# Patient Record
Sex: Male | Born: 1950 | Race: Black or African American | Hispanic: No | Marital: Married | Smoking: Former smoker
Health system: Southern US, Community
[De-identification: ages and names within clinical notes are randomized; demographics above are authoritative.]

## PROBLEM LIST (undated history)

## (undated) DIAGNOSIS — N189 Chronic kidney disease, unspecified: Secondary | ICD-10-CM

## (undated) DIAGNOSIS — T7840XA Allergy, unspecified, initial encounter: Secondary | ICD-10-CM

## (undated) DIAGNOSIS — E785 Hyperlipidemia, unspecified: Secondary | ICD-10-CM

## (undated) DIAGNOSIS — I10 Essential (primary) hypertension: Secondary | ICD-10-CM

## (undated) DIAGNOSIS — M1990 Unspecified osteoarthritis, unspecified site: Secondary | ICD-10-CM

## (undated) DIAGNOSIS — I639 Cerebral infarction, unspecified: Secondary | ICD-10-CM

## (undated) HISTORY — DX: Chronic kidney disease, unspecified: N18.9

## (undated) HISTORY — DX: Cerebral infarction, unspecified: I63.9

## (undated) HISTORY — DX: Hyperlipidemia, unspecified: E78.5

## (undated) HISTORY — DX: Unspecified osteoarthritis, unspecified site: M19.90

## (undated) HISTORY — DX: Essential (primary) hypertension: I10

## (undated) HISTORY — PX: OTHER SURGICAL HISTORY: SHX169

## (undated) HISTORY — DX: Allergy, unspecified, initial encounter: T78.40XA

---

## 2017-05-20 DIAGNOSIS — I639 Cerebral infarction, unspecified: Secondary | ICD-10-CM

## 2017-05-20 HISTORY — DX: Cerebral infarction, unspecified: I63.9

## 2017-09-22 ENCOUNTER — Telehealth (INDEPENDENT_AMBULATORY_CARE_PROVIDER_SITE_OTHER): Payer: Self-pay | Admitting: Neurology

## 2017-09-22 ENCOUNTER — Encounter (INDEPENDENT_AMBULATORY_CARE_PROVIDER_SITE_OTHER): Payer: Self-pay | Admitting: Neurology

## 2017-09-22 LAB — POCT INR: INR: 1 (ref 0.9–1.1)

## 2017-09-22 LAB — PROTIME-INR: Protime: 10.6 (ref 10.0–13.8)

## 2017-09-22 NOTE — Progress Notes (Unsigned)
Onset about 9:30 am today, dysarthia. Seems to be better now but persists. I was called at 6:02 pm.  No weak,numbness, trouble seeing or walking.     Video shows mild dysarthria exam ok other wise.  nih 1.     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    Too late for tpa.  Suggest cta intra and extracranial circ.  Melina Modena, MD  09/22/2017, 21:13

## 2017-09-22 NOTE — Progress Notes (Unsigned)
onset

## 2017-09-26 LAB — BASIC METABOLIC PANEL
BUN: 12 (ref 4–21)
Creatinine: 1.2 (ref 0.6–1.3)
GLUCOSE: 71
Potassium: 4 (ref 3.4–5.3)
Sodium: 142 (ref 137–147)

## 2017-09-26 LAB — HEPATIC FUNCTION PANEL
ALK PHOS: 76 (ref 25–125)
ALT: 18 (ref 10–40)
AST: 25 (ref 14–40)
BILIRUBIN, TOTAL: 1.2

## 2017-09-26 LAB — CBC AND DIFFERENTIAL
HEMATOCRIT: 36 — AB (ref 41–53)
Hemoglobin: 11.8 — AB (ref 13.5–17.5)
Platelets: 201 (ref 150–399)
WBC: 5.2

## 2017-09-26 LAB — LIPID PANEL
Cholesterol: 240 — AB (ref 0–200)
HDL: 49 (ref 35–70)
LDL Cholesterol: 168
TRIGLYCERIDES: 112 (ref 40–160)

## 2017-09-26 LAB — HEMOGLOBIN A1C: Hemoglobin A1C: 5.9

## 2017-09-30 ENCOUNTER — Ambulatory Visit: Payer: Medicare PPO | Attending: Family Medicine | Admitting: Speech Pathology

## 2017-09-30 ENCOUNTER — Other Ambulatory Visit: Payer: Self-pay

## 2017-09-30 ENCOUNTER — Encounter: Payer: Self-pay | Admitting: Speech Pathology

## 2017-09-30 DIAGNOSIS — I69391 Dysphagia following cerebral infarction: Secondary | ICD-10-CM | POA: Insufficient documentation

## 2017-09-30 DIAGNOSIS — R471 Dysarthria and anarthria: Secondary | ICD-10-CM

## 2017-09-30 DIAGNOSIS — I69322 Dysarthria following cerebral infarction: Secondary | ICD-10-CM | POA: Insufficient documentation

## 2017-09-30 DIAGNOSIS — R1312 Dysphagia, oropharyngeal phase: Secondary | ICD-10-CM | POA: Diagnosis not present

## 2017-09-30 DIAGNOSIS — I6990 Unspecified sequelae of unspecified cerebrovascular disease: Secondary | ICD-10-CM | POA: Diagnosis present

## 2017-09-30 NOTE — Patient Instructions (Signed)
   SLOW LOUD OVER-ENNUNCIATE PAUSE  PA TA KA  PATA TAKA KAPA PATAKA  BUTTERCUP  CATERPILLAR  BASEBALLL PLAYER  [place]  [place]  SLOW AND BIG - EXAGGERATE YOUR MOUTH, MAKE EACH CONSONANT    Oral exercises/Swallow exercises  Use the mirror - Do 15x each twice a day  Say "ooo" then "eee"  BIG!  Stick out your tongue and curl tongue up to your nose  Scrape the roof of your mouth from your teeth to your throat   Move the tongue around your top and bottom teeth in a circle, clockwise, then counter clockwise  Click your tongue on the roof of your mouth, like a horse sound  Push out each cheek with your tongue, alternate side to side - push in with your finger to add resistance   Eat high protein, higher fat foods - protein shakes, add protein powder or peanut butter to smoothies  Whole fat Greek yogurt, whole fat coconut milk

## 2017-10-02 ENCOUNTER — Ambulatory Visit: Payer: Medicare PPO | Admitting: Speech Pathology

## 2017-10-02 DIAGNOSIS — R1312 Dysphagia, oropharyngeal phase: Secondary | ICD-10-CM

## 2017-10-02 DIAGNOSIS — I69391 Dysphagia following cerebral infarction: Secondary | ICD-10-CM | POA: Diagnosis not present

## 2017-10-02 NOTE — Therapy (Signed)
[facility] [address] [place], [place], [address] Phone: [phone]   Fax:  [phone]  Speech Language Pathology Evaluation  Patient Details  Name: Randall James MRN: [id number] Date of Birth: 1950/09/21 Referring Provider: Newman Nip [facility]   Encounter Date: 09/30/2017  End of Session - 10/02/17 0834    SLP Start Time  0932    SLP Stop Time   1016    SLP Time Calculation (min)  44 min    Activity Tolerance  Patient tolerated treatment well       History reviewed. No pertinent past medical history.  History reviewed. No pertinent surgical history.  There were no vitals filed for this visit.      SLP Evaluation [facility] - 10/02/17 0830      SLP Visit Information   SLP Received On  09/30/17    Referring Provider  Newman Nip [facility]    Onset Date  09/22/17    Medical Diagnosis  left basal ganglia CVA      Subjective   Patient/Family Stated Goal  To speak better and chew my food      Pain Assessment   Currently in Pain?  No/denies      General Information   HPI  Pt was admitted to [facility] in [place] 09/22/17 to 09/25/17 due to left basal ganglia infarct on MRI. CT revealed old anterior horn right internal capsule infarct, otherwise negative. MBSS 09/25/17 revealed delayed initiation of swallow at the pyriforms, reduced tongue base retraction, partial epiglottic inversation, partial anterior hyoid excursion and reduced laryngeal elevation. Penetration to the vocal folds with thin liquids with spontaneous cough to clear. Chin tuck ineffective, diffuse residue on tongue base, pharyngeal wall, valleculae and aryepiglottic folds. Oral dysphagia with residue throughout mouth, disorganized chewing, sluggish tongue movement and labibal leakage. Mechanical soft diet with nectar thick liquids was recommended with small bites/sips, multiple dry swallows, alternate solids/liquids, check for pocketing.     Mobility Status  walks independently      Balance Screen   Has the patient fallen in the past 6 months  No    Has the patient had a decrease in activity level because of a fear of falling?   No    Is the patient reluctant to leave their home because of a fear of falling?   No      Prior Functional Status   Cognitive/Linguistic Baseline  Within functional limits    Type of Home  House     Lives With  Spouse;Family    Available Support  Family    Vocation  Retired      Land Function   Overall Oral Motor/Sensory Function  Impaired    Labial ROM  Reduced right    Labial Symmetry  Abnormal symmetry right    Labial Strength  Reduced Right    Labial Sensation  Within Functional Limits    Labial Coordination  Reduced    Lingual ROM  Reduced right;Reduced left    Lingual Symmetry  Abnormal symmetry right    Lingual Strength  Reduced Right    Lingual Sensation  Within Functional Limits    Lingual Coordination  Reduced    Facial ROM  Within Functional Limits    Velum  Impaired right      Motor Speech   Overall Motor Speech  Impaired    Respiration  Within functional limits    Phonation  Hoarse    Resonance  Hypernasality  Articulation  Impaired    Level of Impairment  Word    Intelligibility  Intelligibility reduced    Word  75-100% accurate    Phrase  75-100% accurate    Sentence  75-100% accurate    Conversation  50-74% accurate    Motor Planning  Witnin functional limits    Motor Speech Errors  Aware    Effective Techniques  Slow rate;Increased vocal intensity;Over-articulate;Pause                      SLP Education - 10/02/17 [phone]    Education provided  Yes    Education Details  HEP for dysphagia and dysarthria; diet modifications and swallow precautions    Person(s) Educated  Patient;Spouse;Child(ren)    Methods  Explanation;Demonstration;Verbal cues;Handout    Comprehension  Verbalized understanding;Returned demonstration;Verbal cues  required;Need further instruction       SLP Short Term Goals - 10/02/17 0835      SLP SHORT TERM GOAL #1   Title  Pt will follow swallow precautions with mod I over 2 sessions and per pt/family report    Time  6    Period  Weeks    Status  New      SLP SHORT TERM GOAL #2   Title  Pt will perform HEP for dysphagia and dysarthria with rare min A over 2 sessions    Time  6    Period  Weeks    Status  New      SLP SHORT TERM GOAL #3   Title  Pt will utilize compensations for dysarthria with rare min A in simple conversation for 12 minutes over 2 sessions    Time  6    Period  Weeks    Status  New      SLP SHORT TERM GOAL #4   Title  Pt/family will verbalize s/s of aspiration pna with rare min A    Time  6    Period  Weeks    Status  New      SLP SHORT TERM GOAL #5   Title  Pt will complete repeat objective swallow study    Time  6    Period  Weeks    Status  New       SLP Long Term Goals - 10/02/17 9604      SLP LONG TERM GOAL #1   Title  Pt will follow swallow precautions independently over 2 sessions    Time  12    Period  Weeks    Status  New      SLP LONG TERM GOAL #2   Title  Pt will tolerate mechanical soft solids (Dysphagia 3) and thin liquids with independent use of compensations    Baseline  pending repeat MBSS results    Time  12    Period  Weeks    Status  New      SLP LONG TERM GOAL #3   Title  Pt will utilize compensations for dysarthria to be 95% intellgilble in noisy environment over 15 minute conversation with rare min A    Time  12    Period  Weeks    Status  New       Plan - 10/02/17 5409    Clinical Impression Statement  [name] is referred for outpt speech therapy 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 MBSS 09/25/17 revealed moderate oropharyngeal dysphagia. See results in HPI and recommended  mechanical soft solids and nectar thick liquids, Upon clinical swallow eval today, pt was not able to manipulate or  masticate a cereal bar. The small bite resulted in minimal bolus manipulation, with bolus remaining anterior tongue. Attempts to masticate resulting in pieces of bar being scattered through his mouth, and eventually he expactorated bolus. At this time, Mr. Creps is only taking Ensure and jello. Improvement in timing of swallow initiation with thin liquid sips today. Family reported improvement of thin liquid swallows, as pt previously coughed significantly immediately. Pt with moderate dysarthria. He recalled compensations for dysathria and utilized them with success in a quiet environment. I had to ask for repetition 3x during the evaluation. Intelligibility is judged to be 80% at simple, short conversation. I recommend skilled ST to maximize intellgilbity and for safe diet advancement to improve nutrition/hydration and QOL.  Repeat swallow study will be warranted prior to diet upgrade.   Speech Therapy Frequency  2x / week    Treatment/Interventions  Aspiration precaution training;Environmental controls;Cueing hierarchy;Oral motor exercises;SLP instruction and feedback;Compensatory strategies;Functional tasks;Diet toleration management by SLP;Trials of upgraded texture/liquids;Internal/external aids;Patient/family education    Potential to Achieve Goals  Good    Potential Considerations  Severity of impairments    Consulted and Agree with Plan of Care  Patient;Family member/caregiver    Family Member Consulted  wife, Nelva [facility]; daughter, April       Patient will benefit from skilled therapeutic intervention in order to improve the following deficits and impairments:   Dysarthria and anarthria  Dysphagia, oropharyngeal phase    Problem List There are no active problems to display for this patient.   Neyla Gauntt, Radene Journey MS, CCC-SLP 10/02/2017, 8:41 AM  [facility] [address] [place], [place], [address] Phone: [phone]   Fax:   [phone]  Name: Randall James MRN: [id number] Date of Birth: [birth year]

## 2017-10-02 NOTE — Patient Instructions (Addendum)
HOME SWALLOWING THERAPY PROGRAM These exercises will help to build strength and maintain the range of motion in the structures that contribute to swallowing.   For all exercises, complete routine 2-3 times a day.  ?     Tongue Press   Press your entire tongue to roof of mouth and hold for 3 seconds. x10  ?     Head Lift Exercise (Shaker)   Lie down.  Lift head enough to see your toes and hold for 45-60 seconds. Be sure not to hold your breath.  Relax for 60 seconds.  Then repeat 2 more times.  Next, raise and lower your head up and down for 30 lifts/pulses.    ?     Tongue pull-back    Hold your tongue with a piece of gauze and resist as you pull the base of your tongue towards the back of your mouth. Hold for 3-5 seconds.                                                                                                                              ?     Effortful/Hard Swallow  . Gather some saliva/ or you can take a small sip of nectar thick liquid.  Swallow as hard as you can, squeezing all the muscles in your throat. . Aim for 25 swallows  ?     Google  . Try to do a long, slow swallow.  At the height of your swallow (when you gulp), hold your throat in the upright position for 3-5 seconds, then relax. . At first, see if you can do 5-10 swallows. . Work up to 3-4 sets of 10 swallows, with a 5 minute rest in between.

## 2017-10-02 NOTE — Therapy (Signed)
[facility] [address] [place], [place], [address] Phone: [phone]   Fax:  [phone]  Speech Language Pathology Treatment  Patient Details  Name: Randall James MRN: [id number] Date of Birth: 05-19-1951 Referring Provider: Newman Nip [facility]   Encounter Date: 10/02/2017  End of Session - 10/02/17 1751    Visit Number  2    Number of Visits  25    Date for SLP Re-Evaluation  12/23/17    SLP Start Time  1447    SLP Stop Time   1530    SLP Time Calculation (min)  43 min    Activity Tolerance  Patient tolerated treatment well       No past medical history on file.  No past surgical history on file.  There were no vitals filed for this visit.  Subjective Assessment - 10/02/17 1452    Subjective  "I branched out" (pt reports eating mashed potatoes, soft shredded meat)    Patient is accompained by:  Family member Randall James    Currently in Pain?  No/denies        SLP Evaluation [facility] - 10/02/17 0830      SLP Visit Information   SLP Received On  09/30/17    Referring Provider  Cariss Wardell Heath [facility]    Onset Date  09/22/17    Medical Diagnosis  left basal ganglia CVA      Subjective   Patient/Family Stated Goal  To speak better and chew my food      Pain Assessment   Currently in Pain?  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 CT revealed old anterior horn right internal capsule infarct, otherwise negative. MBSS 09/25/17 revealed delayed initiation of swallow at the pyriforms, reduced tongue base retraction, partial epiglottic inversation, partial anterior hyoid excursion and reduced laryngeal elevation. Penetration to the vocal folds with thin liquids with spontaneous cough to clear. Chin tuck ineffective, diffuse residue on tongue base, pharyngeal wall, valleculae and aryepiglottic folds. Oral  dysphagia with residue throughout mouth, disorganized chewing, sluggish tongue movement and labibal leakage. Mechanical soft diet with nectar thick liquids was recommended with small bites/sips, multiple dry swallows, alternate solids/liquids, check for pocketing.     Mobility Status  walks independently      Balance Screen   Has the patient fallen in the past 6 months  No    Has the patient had a decrease in activity level because of a fear of falling?   No    Is the patient reluctant to leave their home because of a fear of falling?   No      Prior Functional Status   Cognitive/Linguistic Baseline  Within functional limits    Type of Home  House     Lives With  Spouse;Family    Available Support  Family    Vocation  Retired      Oral Motor/Sensory Function   Overall Oral Motor/Sensory Function  Impaired    Labial ROM  Reduced right    Labial Symmetry  Abnormal symmetry right    Labial Strength  Reduced Right    Labial Sensation  Within Functional Limits    Labial Coordination  Reduced    Lingual ROM  Reduced right;Reduced left    Lingual Symmetry  Abnormal symmetry right    Lingual Strength  Reduced  Right    Lingual Sensation  Within Functional Limits    Lingual Coordination  Reduced    Facial ROM  Within Functional Limits    Velum  Impaired right      Motor Speech   Overall Motor Speech  Impaired    Respiration  Within functional limits    Phonation  Hoarse    Resonance  Hypernasality    Articulation  Impaired    Level of Impairment  Word    Intelligibility  Intelligibility reduced    Word  75-100% accurate    Phrase  75-100% accurate    Sentence  75-100% accurate    Conversation  50-74% accurate    Motor Planning  Witnin functional limits    Motor Speech Errors  Aware    Effective Techniques  Slow rate;Increased vocal intensity;Over-articulate;Pause         ADULT SLP TREATMENT - 10/02/17 1448      General Information   Behavior/Cognition   Alert;Cooperative;Pleasant mood      Treatment Provided   Treatment provided  Dysphagia      Dysphagia Treatment   Temperature Spikes Noted  No    Respiratory Status  Room air    Patient observed directly with PO's  Yes    Type of PO's observed  Nectar-thick liquids    Feeding  Able to feed self    Liquids provided via  Cup    Type of cueing  Verbal;Tactile;Visual    Amount of cueing  Moderate    Other treatment/comments  SLP provided additional exercises to target base of tongue retraction and hyolarygneal excursion. Trained pt in tongue press (10 reps) effortful swallow (reps x10, pt requires extended time), Mendelsohn manuever (4 accurate repetitions), Shaker (3x 60 second holds and 30 1 second reps), tongue pull-back (10 reps). Occasional min-mod A for technique. Provided pt with written instructions; pt to complete reps as tolerated 2-3 times per day.       Pain Assessment   Pain Assessment  No/denies pain      Assessment / Recommendations / Plan   Plan  Continue with current plan of care      Dysphagia Recommendations   Diet recommendations  Dysphagia 1 (puree);Nectar-thick liquid    Liquids provided via  Cup    Compensations  Check for pocketing;Check for anterior loss;Small sips/bites      Progression Toward Goals   Progression toward goals  Progressing toward goals       SLP Education - 10/02/17 1751    Education provided  Yes    Education Details  Pharyngeal exercises added to HEP    Person(s) Educated  Spouse;Child(ren)    Methods  Explanation;Demonstration;Tactile cues;Verbal cues;Handout    Comprehension  Verbalized understanding;Returned demonstration;Need further instruction;Verbal cues required       SLP Short Term Goals - 10/02/17 1805      SLP SHORT TERM GOAL #1   Title  Pt will follow swallow precautions with mod I over 2 sessions and per pt/family report    Time  6    Period  Weeks    Status  On-going      SLP SHORT TERM GOAL #2   Title  Pt will  perform HEP for dysphagia and dysarthria with rare min A over 2 sessions    Time  6    Period  Weeks    Status  On-going      SLP SHORT TERM GOAL #3   Title  Pt will  utilize compensations for dysarthria with rare min A in simple conversation for 12 minutes over 2 sessions    Time  6    Period  Weeks    Status  On-going      SLP SHORT TERM GOAL #4   Title  Pt/family will verbalize s/s of aspiration pna with rare min A    Time  6    Period  Weeks    Status  On-going      SLP SHORT TERM GOAL #5   Title  Pt will complete repeat objective swallow study    Time  6    Period  Weeks    Status  On-going       SLP Long Term Goals - 10/02/17 1805      SLP LONG TERM GOAL #1   Title  Pt will follow swallow precautions independently over 2 sessions    Time  12    Period  Weeks    Status  On-going      SLP LONG TERM GOAL #2   Title  Pt will tolerate mechanical soft solids (Dysphagia 3) and thin liquids with independent use of compensations    Time  12    Period  Weeks    Status  On-going      SLP LONG TERM GOAL #3   Title  Pt will utilize compensations for dysarthria to be 95% intellgilble in noisy environment over 15 minute conversation with rare min A    Time  12    Period  Weeks    Status  On-going       Plan - 10/02/17 1752    Clinical Impression Statement  Randall James is referred for outpt speech therapy 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 MBSS 09/25/17 revealed moderate oropharyngeal dysphagia. See results in HPI and recommended mechanical soft solids and nectar thick liquids, Upon clinical swallow eval, pt was not able to manipulate or masticate a cereal bar. Most revealed reduce base of tongue retraction, reduced hyolaryngeal elevation/excursion, therefore pharyngeal exercises added to HEP today. I recommend skilled ST to maximize intelligibility and for safe diet advancement to improve nutrition/hydration and QOL.  Repeat swallow study will be  warranted prior to diet upgrade.     Speech Therapy Frequency  2x / week    Treatment/Interventions  Aspiration precaution training;Environmental controls;Cueing hierarchy;Oral motor exercises;SLP instruction and feedback;Compensatory strategies;Functional tasks;Diet toleration management by SLP;Trials of upgraded texture/liquids;Internal/external aids;Patient/family education    Potential to Achieve Goals  Good    Potential Considerations  Severity of impairments    Consulted and Agree with Plan of Care  Patient;Family member/caregiver    Family Member Consulted  wife, Randall James; daughter, Randall James       Patient will benefit from skilled therapeutic intervention in order to improve the following deficits and impairments:   Dysphagia, oropharyngeal phase    Problem List There are no active problems to display for this patient.  Rondel Baton, MS, CCC-SLP Speech-Language Pathologist   Arlana Lindau 10/02/2017, 6:06 PM  [facility] [address] [place], [place], [address] Phone: [phone]   Fax:  [phone]   Name: Randall James MRN: [id number] Date of Birth: 05-29-1950

## 2017-10-10 ENCOUNTER — Other Ambulatory Visit (HOSPITAL_COMMUNITY): Payer: Self-pay | Admitting: Family

## 2017-10-10 DIAGNOSIS — R1310 Dysphagia, unspecified: Secondary | ICD-10-CM

## 2017-10-16 ENCOUNTER — Ambulatory Visit (HOSPITAL_COMMUNITY)
Admission: RE | Admit: 2017-10-16 | Discharge: 2017-10-16 | Disposition: A | Discharge status: Home / Self Care | Payer: Medicare PPO | Source: Ambulatory Visit | Attending: Family | Admitting: Family

## 2017-10-16 ENCOUNTER — Ambulatory Visit: Payer: Medicare PPO | Admitting: Speech Pathology

## 2017-10-16 DIAGNOSIS — R1310 Dysphagia, unspecified: Secondary | ICD-10-CM | POA: Diagnosis present

## 2017-10-16 DIAGNOSIS — R471 Dysarthria and anarthria: Secondary | ICD-10-CM

## 2017-10-16 DIAGNOSIS — R1312 Dysphagia, oropharyngeal phase: Secondary | ICD-10-CM | POA: Diagnosis not present

## 2017-10-16 DIAGNOSIS — I69391 Dysphagia following cerebral infarction: Secondary | ICD-10-CM | POA: Diagnosis not present

## 2017-10-16 NOTE — Therapy (Signed)
Modified Barium Swallow Progress Note  Patient Details  Name: Randall James MRN: [id number] Date of Birth: 24-Apr-1951  Today's Date: 10/16/2017  Modified Barium Swallow completed.  Full report located under Chart Review in the Imaging Section.  Brief recommendations include the following:  Clinical Impression Pt presents with mild oropharyngeal dysphagia, characterized as follows:   ORALLY, pt exhibits adequate bolus formation and posterior propulsion of liquids and puree. Oral prep of solids was extended, with piecemeal swallow and slight oral residue. Dry swallow effectively cleared oral residue. Premature spillage over the tongue base was noted across consistencies.  PHARYNGEALLY, pt exhibits delayed swallow reflex across consistencies, with trigger at the pyriform sinus on thin and nectar thick liquids, and at the vallecular sinus on puree and solid consistencies. A delay of this significance increases risk for aspiration before the swallow on liquids. Intermittent (2 episodes), trace, flash penetration was seen on thin liquid via cup and straw (seen only once on each). Penetrate cleared completely and spontaneously, and was not aspirated. Multiple presentations were given of both cup and straw drinking. There was no post-swallow residue on any consistency.  At this time, recommend continuing with soft moistened solids with thin liquids via cup or straw, Adherence to safe swallow precautions is critical. These were reviewed with pt and daughter, and provided to them in written form. Pt's daughter indicated an appointment with OP Speech Therapy this afternoon. Results of this assessment were relayed to primary SLP. Will defer ongoing recommendations for treatment to outpatient therapist.     Swallow Evaluation Recommendations  SLP Diet Recommendations: Dysphagia 3 (Mech soft) solids;Thin liquid   Liquid Administration via: Cup;Straw   Medication Administration: Other (Comment)(1  at a time)   Supervision: Patient able to self feed;Full supervision/cueing for compensatory strategies   Compensations: Minimize environmental distractions;Slow rate;Small sips/bites   Postural Changes: Remain semi-upright after after feeds/meals (Comment);Seated upright at 90 degrees   Oral Care Recommendations: Oral care QID     Rula Keniston B. Murvin Natal, [facility], CCC-SLP Speech Language Pathologist [phone]   Leigh Aurora 10/16/2017,2:16 PM

## 2017-10-16 NOTE — Therapy (Signed)
[facility] [address] [place], [place], [address] Phone: [phone]   Fax:  [phone]  Speech Language Pathology Treatment  Patient Details  Name: Randall James MRN: [id number] Date of Birth: 08/02/1950 Referring Provider: Newman Nip [facility]   Encounter Date: 10/16/2017  End of Session - 10/16/17 1847    Visit Number  3    Number of Visits  25    Date for SLP Re-Evaluation  12/23/17    SLP Start Time  1533    SLP Stop Time   1617    SLP Time Calculation (min)  44 min    Activity Tolerance  Patient tolerated treatment well       No past medical history on file.  No past surgical history on file.  There were no vitals filed for this visit.  Subjective Assessment - 10/16/17 1541    Subjective  "I don't have to use thickener that much."    Patient is accompained by:  Family member Nelva Bush, and daughter April    Currently in Pain?  No/denies            ADULT SLP TREATMENT - 10/16/17 1533      General Information   Behavior/Cognition  Alert;Cooperative;Pleasant mood    Patient Positioning  Upright in chair    Oral care provided  N/A      Treatment Provided   Treatment provided  Dysphagia;Cognitive-Linquistic      Dysphagia Treatment   Temperature Spikes Noted  No    Respiratory Status  Room air    Oral Cavity - Dentition  Adequate natural dentition    Treatment Methods  Skilled observation;Upgraded PO texture trial;Compensation strategy training;Patient/caregiver education    Patient observed directly with PO's  Yes    Type of PO's observed  Thin liquids;Dysphagia 3 (soft)    Feeding  Able to feed self    Liquids provided via  Straw    Oral Phase Signs & Symptoms  Prolonged mastication;Prolonged bolus formation;Other (comment) mild residue    Type of cueing  Verbal    Amount of cueing  Moderate    Other treatment/comments  Pt had MBS this morning showing adequate pharyngeal strength,  delayed swallow initiation to pyriform sinuses/premature spillage due to oral deficits persists. SLP reviewed MBS findings, using MBS video as teachback to demonstrate necessity of following swallowing precautions (small bites and sips, slow rate, reduce distractions, moisten solids) as risk for aspiration persists. Provided written and verbal education re: swallow precautions and signs of aspiration pneumonia, and reinforced swallow precautions as pt self-fed soft solid and thin liquids. Mod cues to refrain from conversation/ ignore distractions. Revised pt's HEP to focus on oral deficits and lingual weakness, removed Shaker and Mendelsohn from pt's HEP. Pt completed 10 effortful swallows in ~70 seconds, significant improvement from previous session.       Pain Assessment   Pain Assessment  No/denies pain      Cognitive-Linquistic Treatment   Treatment focused on  Dysarthria    Skilled Treatment  Pt demo'd HEP for dysarthria with occasional min-mod A to slow rate and for overarticulation of lingual consonants. SLP provided skilled feedback during conversation for pt to use slow rate and overarticulation.       Assessment / Recommendations / Plan   Plan  Continue with current plan of care      Dysphagia Recommendations   Diet recommendations  Dysphagia 3 (mechanical soft);Thin liquid    Liquids provided via  Cup;Straw    Medication Administration  Other (Comment) one at a time, whole with liquid or puree as tolerated    Supervision  Patient able to self feed    Compensations  Slow rate;Small sips/bites;Follow solids with liquid moisten dry foods with sauce or gravy, minimize distractions      Progression Toward Goals   Progression toward goals  Progressing toward goals       SLP Education - 10/16/17 1846    Education provided  Yes    Education Details  signs of aspiration PNA, revised HEP, swallow precautions    Person(s) Educated  Patient    Methods  Explanation;Demonstration;Verbal  cues;Handout    Comprehension  Need further instruction       SLP Short Term Goals - 10/16/17 1850      SLP SHORT TERM GOAL #1   Title  Pt will follow swallow precautions with mod I over 2 sessions and per pt/family report    Time  5    Period  Weeks    Status  On-going      SLP SHORT TERM GOAL #2   Title  Pt will perform HEP for dysphagia and dysarthria with rare min A over 2 sessions    Time  5    Period  Weeks    Status  On-going      SLP SHORT TERM GOAL #3   Title  Pt will utilize compensations for dysarthria with rare min A in simple conversation for 12 minutes over 2 sessions    Time  5    Period  Weeks    Status  On-going      SLP SHORT TERM GOAL #4   Title  Pt/family will verbalize s/s of aspiration pna with rare min A    Time  5    Period  Weeks    Status  On-going      SLP SHORT TERM GOAL #5   Title  Pt will complete repeat objective swallow study    Time  5    Period  Weeks    Status  Achieved       SLP Long Term Goals - 10/16/17 1851      SLP LONG TERM GOAL #1   Title  Pt will follow swallow precautions independently over 2 sessions    Time  11    Period  Weeks    Status  On-going      SLP LONG TERM GOAL #2   Title  Pt will tolerate mechanical soft solids (Dysphagia 3) and thin liquids with independent use of compensations    Time  11    Period  Weeks    Status  On-going      SLP LONG TERM GOAL #3   Title  Pt will utilize compensations for dysarthria to be 95% intellgilble in noisy environment over 15 minute conversation with rare min A    Time  11    Period  Weeks    Status  On-going       Plan - 10/16/17 1848    Clinical Impression Statement  Randall James is seen for speech therapy 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 Repeat MBSS 10/16/17 revealed mild oropharyngeal dysphagia. See results in HPI and recommended mechanical soft solids and thin liquids. Pt requiring min A in HEP for dysphagia and dysarthria,  demonstrating improvements in oral manipulation and mastication. I recommend skilled ST to maximize intelligibility and for training in swallow  precautions to ensure safe diet advancement to improve nutrition/hydration and QOL.    Speech Therapy Frequency  2x / week    Treatment/Interventions  Aspiration precaution training;Environmental controls;Cueing hierarchy;Oral motor exercises;SLP instruction and feedback;Compensatory strategies;Functional tasks;Diet toleration management by SLP;Trials of upgraded texture/liquids;Internal/external aids;Patient/family education    Potential to Achieve Goals  Good    Potential Considerations  Severity of impairments    Consulted and Agree with Plan of Care  Patient;Family member/caregiver    Family Member Consulted  wife, Nelva Bush; daughter, April       Patient will benefit from skilled therapeutic intervention in order to improve the following deficits and impairments:   Dysphagia, oropharyngeal phase  Dysarthria and anarthria    Problem List There are no active problems to display for this patient.  Rondel Baton, MS, CCC-SLP Speech-Language Pathologist  Arlana Lindau 10/16/2017, 6:54 PM  [facility] [address] [place], [place], [address] Phone: [phone]   Fax:  [phone]   Name: Ekin Pilar MRN: [id number] Date of Birth: June 21, 1950

## 2017-10-16 NOTE — Patient Instructions (Addendum)
Swallow Precautions (do this every time you eat or drink)  - Eat soft foods with extra sauce or gravy to moisten - You can drink regular, thin liquids - Small bites and sips - Drink only one sip at time  - Straws are OK (stick to one sip though)    Signs of Aspiration Pneumonia   . Chest pain/tightness . Fever (can be low grade) . Cough  o With foul-smelling phlegm (sputum) o With sputum containing pus or blood o With greenish sputum . Fatigue  . Shortness of breath  . Wheezing   **IF YOU HAVE THESE SIGNS, CONTACT YOUR DOCTOR OR GO TO THE EMERGENCY DEPARTMENT OR URGENT CARE AS SOON AS POSSIBLE**     SLOW LOUD OVER-ENNUNCIATE PAUSE  PA TA KA  PATA TAKA KAPA PATAKA  BUTTERCUP  CATERPILLAR  BASEBALLL PLAYER  [place]  [place]  SLOW AND BIG - EXAGGERATE YOUR MOUTH, MAKE EACH CONSONANT     HOME SWALLOWING THERAPY PROGRAM These exercises will help to build strength and maintain the range of motion in the structures that contribute to swallowing.   For all exercises, complete routine 2-3 times a day.  ?     Tongue Press   Press your entire tongue to roof of mouth and hold for 3 seconds. x10   ?     Tongue pull-back               Hold your tongue with a piece of gauze and resist as you pull the base of your tongue towards the back of your mouth. Hold for 3-5 seconds. x10                                                                                                                              ?     Effortful/Hard Swallow   Gather some saliva/ or you can take a small sip of nectar thick liquid.  Swallow as hard as you can, squeezing all the muscles in your throat.  Aim for 25 swallows   Oral exercises/Swallow exercises  Use the mirror - Do 15x each twice a day  Say "ooo" then "eee"  BIG!  Stick out your tongue and curl tongue up to your nose  Scrape the roof of your mouth from your teeth to your throat    Move the tongue around your top and bottom teeth in a circle, clockwise, then counter clockwise  Click your tongue on the roof of your mouth, like a horse sound  Push out each cheek with your tongue, alternate side to side - push in with your finger to add resistance

## 2017-10-20 ENCOUNTER — Ambulatory Visit: Payer: Medicare PPO | Attending: Family Medicine | Admitting: Speech Pathology

## 2017-10-20 DIAGNOSIS — R1312 Dysphagia, oropharyngeal phase: Secondary | ICD-10-CM | POA: Insufficient documentation

## 2017-10-20 DIAGNOSIS — R471 Dysarthria and anarthria: Secondary | ICD-10-CM | POA: Insufficient documentation

## 2017-10-20 NOTE — Therapy (Signed)
[facility] [address] [place], [place], [address] Phone: [phone]   Fax:  [phone]  Speech Language Pathology Treatment  Patient Details  Name: Randall ParkerRobert Jerome James MRN: [id number] Date of Birth: [birth year] Referring Provider: [facility]   Encounter Date: 10/20/2017  End of Session - 10/20/17 1737    Visit Number  4    Number of Visits  25    Date for SLP Re-Evaluation  12/23/17    SLP Start Time  1402    SLP Stop Time   1445    SLP Time Calculation (min)  43 min    Activity Tolerance  Patient tolerated treatment well       No past medical history on file.  No past surgical history on file.  There were no vitals filed for this visit.  Subjective Assessment - 10/20/17 1405    Subjective  "I worked up to 10" (swallows)    Patient is accompained by:  Family member wife    Currently in Pain?  No/denies            ADULT SLP TREATMENT - 10/20/17 1403      General Information   Behavior/Cognition  Alert;Cooperative;Pleasant mood    Patient Positioning  Upright in chair      Treatment Provided   Treatment provided  Cognitive-Linquistic      Pain Assessment   Pain Assessment  No/denies pain      Cognitive-Linquistic Treatment   Treatment focused on  Dysarthria    Skilled Treatment  Pt/family report pt adhering to swallow precautions with occasional cues. Report he is tolerating current diet at home with no coughing/choking. Pt able to state SLOP (Slow, Loud, Overpronounce, Pause) strategies with min question cues. Pt used strategies for multisyllabic words with occasional min A, 90% intelligible for pt's wife. 2-3 sentence spontaneous responses with occasional min A for strategies, particularly pausing. Pt made several self-corrections.      Assessment / Recommendations / Plan   Plan  Continue with current plan of care      Progression Toward Goals   Progression toward goals  Progressing  toward goals       SLP Education - 10/20/17 1737    Education provided  Yes    Education Details  compensations for dysarthria    Person(s) Educated  Patient;Spouse    Methods  Explanation;Verbal cues    Comprehension  Returned demonstration;Need further instruction       SLP Short Term Goals - 10/20/17 1408      SLP SHORT TERM GOAL #1   Title  Pt will follow swallow precautions with mod I over 2 sessions and per pt/family report    Baseline  10/20/17    Time  5      SLP SHORT TERM GOAL #2   Title  Pt will perform HEP for dysphagia and dysarthria with rare min A over 2 sessions    Time  4    Period  Weeks    Status  On-going      SLP SHORT TERM GOAL #3   Title  Pt will utilize compensations for dysarthria with rare min A in simple conversation for 12 minutes over 2 sessions    Time  4    Period  Weeks    Status  On-going      SLP SHORT TERM GOAL #4   Title  Pt/family will verbalize s/s of aspiration pna with rare min A  Time  4    Period  Weeks    Status  On-going      SLP SHORT TERM GOAL #5   Title  Pt will complete repeat objective swallow study    Time  4    Period  Weeks    Status  Achieved       SLP Long Term Goals - 10/20/17 1409      SLP LONG TERM GOAL #1   Title  Pt will follow swallow precautions independently over 2 sessions    Time  10    Period  Weeks    Status  On-going      SLP LONG TERM GOAL #2   Title  Pt will tolerate mechanical soft solids (Dysphagia 3) and thin liquids with independent use of compensations    Time  10    Period  Weeks    Status  On-going      SLP LONG TERM GOAL #3   Title  Pt will utilize compensations for dysarthria to be 95% intellgilble in noisy environment over 15 minute conversation with rare min A    Time  10    Period  Weeks    Status  On-going       Plan - 10/20/17 1737    Clinical Impression Statement  Mr. 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  Repeat MBSS 10/16/17 revealed mild oropharyngeal dysphagia. See results in HPI and recommended mechanical soft solids and thin liquids. Occasional mod A for dysarthria compensations in 2-3 sentence spontaneous responses today. I recommend skilled ST to maximize intelligibility and for training in swallow precautions to ensure safe diet advancement to improve nutrition/hydration and QOL.    Speech Therapy Frequency  2x / week    Treatment/Interventions  Aspiration precaution training;Environmental controls;Cueing hierarchy;Oral motor exercises;SLP instruction and feedback;Compensatory strategies;Functional tasks;Diet toleration management by SLP;Trials of upgraded texture/liquids;Internal/external aids;Patient/family education    Potential to Achieve Goals  Good    Potential Considerations  Severity of impairments    Consulted and Agree with Plan of Care  Patient;Family member/caregiver    Family Member Consulted  wife, Nelva Bush       Patient will benefit from skilled therapeutic intervention in order to improve the following deficits and impairments:   Dysarthria and anarthria    Problem List There are no active problems to display for this patient.  Rondel Baton, [place], CCC-SLP Speech-Language Pathologist  Arlana Lindau 10/20/2017, 5:39 PM  [facility] [address] [place], [place], [address] Phone: [phone]   Fax:  [phone]   Name: Randall James MRN: [id number] Date of Birth: [birth year]

## 2017-10-22 ENCOUNTER — Ambulatory Visit: Payer: Medicare PPO

## 2017-10-22 DIAGNOSIS — R471 Dysarthria and anarthria: Secondary | ICD-10-CM | POA: Diagnosis not present

## 2017-10-22 DIAGNOSIS — R1312 Dysphagia, oropharyngeal phase: Secondary | ICD-10-CM

## 2017-10-22 NOTE — Therapy (Signed)
[facility] [address] [place], [place], [address] Phone: [phone]   Fax:  [phone]  Speech Language Pathology Treatment  Patient Details  Name: Randall James MRN: [id number] Date of Birth: Jan 25, 1951 Referring Provider: Newman Nip [facility]   Encounter Date: 10/22/2017  End of Session - 10/22/17 0959    Visit Number  5    Number of Visits  25    Date for SLP Re-Evaluation  12/23/17    SLP Start Time  0850    SLP Stop Time   0930    SLP Time Calculation (min)  40 min       History reviewed. No pertinent past medical history.  History reviewed. No pertinent surgical history.  There were no vitals filed for this visit.  Subjective Assessment - 10/22/17 0851    Subjective  "I got them." (homework)    Patient is accompained by:  Family member daughter    Currently in Pain?  No/denies            ADULT SLP TREATMENT - 10/22/17 0859      General Information   Behavior/Cognition  Alert;Cooperative;Pleasant mood      Treatment Provided   Treatment provided  Cognitive-Linquistic      Cognitive-Linquistic Treatment   Treatment focused on  Dysarthria    Skilled Treatment  Pt read phrases/sentences from his homework with 70% intelligibility with this SLP - minimal self correction unless SLP provided a nonverbal facial cue, 2nd attempt approx 50% successful at correcting articulation. Pt with most difficulty with "sh", /s/, /z/ sounds. He told SLP of four intelligibility strategies 50% (slow, overaraticulate) and got the other two with mod A.  SLP focused pt's session on /s/ production. Tactile, auditory, and verbal cues were used by SLP to improve pt's production of /s/ initial in two syllable words; success pt describes swallow precautions with occasional min A for intelligibility strategies. SLP reminded pt that in order to progress with more intelligible speech he will need to perform homework and the  HEP on a regular basis. Pt agreed.      Assessment / Recommendations / Plan   Plan  Continue with current plan of care      Progression Toward Goals   Progression toward goals  Progressing toward goals       SLP Education - 10/22/17 0959    Education provided  Yes    Education Details  practice necessary for improvement with speech    Person(s) Educated  Child(ren)    Methods  Explanation    Comprehension  Verbalized understanding       SLP Short Term Goals - 10/22/17 0918      SLP SHORT TERM GOAL #1   Title  Pt will follow swallow precautions with mod I over 2 sessions and per pt/family report    Baseline  10/20/17    Time  5      SLP SHORT TERM GOAL #2   Title  Pt will perform HEP for dysphagia and dysarthria with rare min A over 2 sessions    Time  4    Period  Weeks    Status  On-going      SLP SHORT TERM GOAL #3   Title  Pt will utilize compensations for dysarthria with rare min A in simple conversation for 12 minutes over 2 sessions    Time  4    Period  Weeks    Status  On-going  SLP SHORT TERM GOAL #4   Title  Pt/family will verbalize s/s of aspiration pna with rare min A    Time  4    Period  Weeks    Status  On-going      SLP SHORT TERM GOAL #5   Title  Pt will complete repeat objective swallow study    Time  4    Period  Weeks    Status  Achieved       SLP Long Term Goals - 10/22/17 1003      SLP LONG TERM GOAL #1   Title  Pt will follow swallow precautions independently over 2 sessions    Time  10    Period  Weeks    Status  On-going      SLP LONG TERM GOAL #2   Title  Pt will tolerate mechanical soft solids (Dysphagia 3) and thin liquids with independent use of compensations    Time  10    Period  Weeks    Status  On-going      SLP LONG TERM GOAL #3   Title  Pt will utilize compensations for dysarthria to be 95% intellgilble in noisy environment over 15 minute conversation with rare min A    Time  10    Period  Weeks    Status   On-going       Plan - 10/22/17 1000    Clinical Impression Statement  [name] presents today with 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 His self-monitoring skills for decr'd intelligibility are improving however he does not always note his errors and attempt to repeat. Assistance cotinues as necessary today for use of dysarthria compensations. I recommend cont'd skilled ST to maximize intelligibility and for training in swallow precautions to ensure safe diet advancement to improve nutrition/hydration and QOL.    Speech Therapy Frequency  2x / week    Treatment/Interventions  Aspiration precaution training;Environmental controls;Cueing hierarchy;Oral motor exercises;SLP instruction and feedback;Compensatory strategies;Functional tasks;Diet toleration management by SLP;Trials of upgraded texture/liquids;Internal/external aids;Patient/family education    Potential to Achieve Goals  Good    Potential Considerations  Severity of impairments    Consulted and Agree with Plan of Care  Patient;Family member/caregiver    Family Member Consulted  wife, Nelva Bushorma       Patient will benefit from skilled therapeutic intervention in order to improve the following deficits and impairments:   Dysarthria and anarthria  Dysphagia, oropharyngeal phase    Problem List There are no active problems to display for this patient.   [facility] Campus[facility]INKE,Anetta Olvera ,MS, CCC-SLP  10/22/2017, 10:03 AM  [facility] [address] [place], [place], [address] Phone: [phone]   Fax:  [phone]   Name: Randall James MRN: [id number] Date of Birth: Jan 31, 1951

## 2017-10-24 ENCOUNTER — Encounter: Payer: Self-pay | Admitting: Internal Medicine

## 2017-10-24 ENCOUNTER — Ambulatory Visit (INDEPENDENT_AMBULATORY_CARE_PROVIDER_SITE_OTHER): Payer: Medicare PPO | Admitting: Internal Medicine

## 2017-10-24 VITALS — BP 112/70 | HR 61 | Temp 98.0°F | Ht 67.0 in | Wt 156.0 lb

## 2017-10-24 DIAGNOSIS — I10 Essential (primary) hypertension: Secondary | ICD-10-CM | POA: Diagnosis not present

## 2017-10-24 DIAGNOSIS — I69322 Dysarthria following cerebral infarction: Secondary | ICD-10-CM | POA: Diagnosis not present

## 2017-10-24 DIAGNOSIS — I69391 Dysphagia following cerebral infarction: Secondary | ICD-10-CM | POA: Insufficient documentation

## 2017-10-24 DIAGNOSIS — Z79899 Other long term (current) drug therapy: Secondary | ICD-10-CM

## 2017-10-24 DIAGNOSIS — E782 Mixed hyperlipidemia: Secondary | ICD-10-CM

## 2017-10-24 MED ORDER — ATORVASTATIN CALCIUM 40 MG PO TABS: 40.0000 mg | ORAL_TABLET | Freq: Every day | ORAL | 0 refills | Status: DC

## 2017-10-24 MED ORDER — CLOPIDOGREL BISULFATE 75 MG PO TABS: 75.0000 mg | ORAL_TABLET | Freq: Every day | ORAL | 3 refills | Status: DC

## 2017-10-24 MED ORDER — AMLODIPINE BESYLATE 10 MG PO TABS: 10.0000 mg | ORAL_TABLET | Freq: Every day | ORAL | 0 refills | Status: DC

## 2017-10-24 NOTE — Progress Notes (Signed)
Patient ID: Randall James, male   DOB: 05/07/1951, 67 y.o.   MRN: [id number]   Location:  [facility] OFFICE  Provider: DR Arletha Grippe  Code Status:  Goals of Care:  Advanced Directives 10/24/2017  Does Patient Have a Medical Advance Directive? No  Would patient like information on creating a medical advance directive? Yes (MAU/Ambulatory/Procedural Areas - Information given)     Chief Complaint  Patient presents with  . Establish Care    new patient    HPI: Patient is a 67 y.o. male seen today as a new pt. He transferred from [facility]. He had a CVA on 09/22/17.   HTN - BP stable on amlodipine. Home BP 110-120s/70-80s.   Hx CVA - stable on Plavix and ASA. He takes neurorehab for speech tx. He has not seen neurology yet and needs referral  Hyperlipidemia - no myalgias. Takes lipitor  tobacco abuse - quit smoking several yrs ago. HE DOES CHEW TOBACCO    Past Medical History:  Diagnosis Date  . Hyperlipidemia   . Hypertension   . Stroke [facility]) 2019    No past surgical history on file.   reports that he has quit smoking. He has quit using smokeless tobacco. His smokeless tobacco use included chew. He reports that he drank alcohol. He reports that he has current or past drug history. Social History   Socioeconomic History  . Marital status: Married    Spouse name: Not on file  . Number of children: Not on file  . Years of education: Not on file  . Highest education level: Not on file  Occupational History  . Not on file  Social Needs  . Financial resource strain: Not on file  . Food insecurity:    Worry: Not on file    Inability: Not on file  . Transportation needs:    Medical: Not on file    Non-medical: Not on file  Tobacco Use  . Smoking status: Former Research scientist (life sciences)  . Smokeless tobacco: Former Systems developer    Types: Chew  Substance and Sexual Activity  . Alcohol use: Not Currently  . Drug use: Not Currently  . Sexual activity: Not Currently  Lifestyle  . Physical  activity:    Days per week: Not on file    Minutes per session: Not on file  . Stress: Not on file  Relationships  . Social connections:    Talks on phone: Not on file    Gets together: Not on file    Attends religious service: Not on file    Active member of club or organization: Not on file    Attends meetings of clubs or organizations: Not on file    Relationship status: Not on file  . Intimate partner violence:    Fear of current or ex partner: Not on file    Emotionally abused: Not on file    Physically abused: Not on file    Forced sexual activity: Not on file  Other Topics Concern  . Not on file  Social History Narrative   Social History      Diet? none      Do you drink/eat things with caffeine? Coffee- yes      Marital status?              married                      What year were you married? 08/08/1973  Do you live in a house, apartment, assisted living, condo, trailer, etc.? house      Is it one or more stories? 2 stories      How many persons live in your home? 2      Do you have any pets in your home? (please list) none      Highest level of education completed? BS in Education and Social Studies      Current or past profession: school teacher      Do you exercise?         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 no      Do you have a DNR form?                                  If not, do you want to discuss one? no      Do you have signed POA/HPOA for forms? no      Functional Status      Do you have difficulty bathing or dressing yourself? yes      Do you have difficulty preparing food or eating? yes      Do you have difficulty managing your medications? yes      Do you have difficulty managing your finances? no      Do you have difficulty affording your medications? No     Family History    Problem Relation Age of Onset  . Cancer Neg Hx   . Hypertension Neg Hx   . Heart disease Neg Hx     No Known Allergies  Outpatient Encounter Medications as of 10/24/2017  Medication Sig  . amLODipine (NORVASC) 10 MG tablet Take 1 tablet (10 mg total) by mouth daily.  Marland Kitchen aspirin 325 MG EC tablet Take 325 mg by mouth daily.  Marland Kitchen atorvastatin (LIPITOR) 40 MG tablet Take 1 tablet (40 mg total) by mouth daily.  . clopidogrel (PLAVIX) 75 MG tablet Take 1 tablet (75 mg total) by mouth daily.  . [DISCONTINUED] amLODipine (NORVASC) 10 MG tablet Take 10 mg by mouth daily.  . [DISCONTINUED] atorvastatin (LIPITOR) 40 MG tablet Take 40 mg by mouth daily.  . [DISCONTINUED] clopidogrel (PLAVIX) 75 MG tablet Take 75 mg by mouth daily.   No facility-administered encounter medications on file as of 10/24/2017.     Review of Systems:  Review of Systems  Constitutional: Positive for appetite change (LOSS), fatigue and unexpected weight change (LOSS).  HENT: Positive for dental problem (wears dentures) and trouble swallowing.   Neurological: Positive for weakness.  All other systems reviewed and are negative.   Health Maintenance  Topic Date Due  . Hepatitis C Screening  1950/07/23  . TETANUS/TDAP  03/31/1968  . COLONOSCOPY  04/01/1999  . PNA vac Low Risk Adult (1 of 2 - PCV13) 03/31/2014  . INFLUENZA VACCINE  12/18/2017    Physical Exam: Vitals:   10/24/17 1058  BP: 112/70  Pulse: 61  Temp: 98 F (36.7 C)  TempSrc: Oral  SpO2: 98%  Weight: 156 lb (70.8 kg)  Height: '5\' 7"'  (1.702 m)   Body mass  index is 24.43 kg/m. Physical Exam  Constitutional: He is oriented to person, place, and time. He appears well-developed and well-nourished.  HENT:  Mouth/Throat: Oropharynx is clear and moist.  MMM; no oral thrush  Eyes: Pupils are equal, round, and reactive to light. No scleral icterus.  Neck: Neck supple. Carotid bruit is not present. No thyromegaly present.  Cardiovascular: Normal rate,  regular rhythm and intact distal pulses. Exam reveals no gallop and no friction rub.  Murmur (1/6 SEM) heard. no distal LE swelling. No calf TTP  Pulmonary/Chest: Effort normal and breath sounds normal. He has no wheezes. He has no rales. He exhibits no tenderness.  Abdominal: Soft. Bowel sounds are normal. He exhibits no distension, no abdominal bruit, no pulsatile midline mass and no mass. There is no hepatomegaly. There is no tenderness. There is no rebound and no guarding.  Musculoskeletal: He exhibits edema.  Lymphadenopathy:    He has no cervical adenopathy.  Neurological: He is alert and oriented to person, place, and time. He displays abnormal reflex. A cranial nerve deficit (right facial droop; deviated right tongue) is present.   slight right reduced UE and LE weakness  Skin: Skin is warm and dry. No rash noted.  Psychiatric: He has a normal mood and affect. His behavior is normal. Judgment and thought content normal.    Labs reviewed: Basic Metabolic Panel: Recent Labs    09/26/17  NA 142  K 4.0  BUN 12  CREATININE 1.2   Liver Function Tests: Recent Labs    09/26/17  AST 25  ALT 18  ALKPHOS 76   No results for input(s): LIPASE, AMYLASE in the last 8760 hours. No results for input(s): AMMONIA in the last 8760 hours. CBC: Recent Labs    09/26/17  WBC 5.2  HGB 11.8*  HCT 36*  PLT 201   Lipid Panel: Recent Labs    09/26/17  CHOL 240*  HDL 49  LDLCALC 168  TRIG 112   Lab Results  Component Value Date   HGBA1C 5.9 09/26/2017    Procedures since last visit: Dg Op Swallowing Func-medicare/speech Path  Result Date: 10/16/2017 CLINICAL DATA:  Basal ganglia stroke earlier in the month, outside swallowing function study demonstrated various abnormalities. Today's exam is for follow up into assess for any improvement. EXAM: MODIFIED BARIUM SWALLOW TECHNIQUE: Different consistencies of barium were administered orally to the patient by the Speech Pathologist.  Imaging of the pharynx was performed in the lateral projection. FLUOROSCOPY TIME:  Fluoroscopy Time:  1 minutes, 12 seconds Radiation Exposure Index (if provided by the fluoroscopic device): 2.7 mGy Number of Acquired Spot Images: 0 COMPARISON:  None. FINDINGS: Thin liquid: Delayed swallowing trigger. Minimal flash penetration with 1 cup swallow. Minimal flash penetration with 1 swallow from the straw. Otherwise multiple thin liquid swallows without penetration. Nectar thick: Delayed swallowing trigger. Puree: Mildly delayed swallowing trigger. Pure a with cracker: Unremarkable IMPRESSION: 1. Delayed swallowing trigger with various consistencies. Transient flash penetration on 1 cup swallow of thin liquid and with one straw swallow of thin liquid, although multiple swallows of thin liquid were without flash penetration. Please refer to the Speech Pathologists report for complete details and recommendations. Electronically Signed   By: Van Clines M.D.   On: 10/16/2017 13:49 Objective Swallowing Evaluation: Type of Study: MBS-Modified Barium Swallow Study  Patient Details Name: Jaquae Rieves MRN: [id number] Date of Birth: 03-03-1951 Today's Date: 10/16/2017 Time: No data recorded-No data recorded No data recorded Past Medical History: No past  medical history on file. Past Surgical History: No past surgical history on file. HPI: Pt was admitted to [facility] in [place] 09/22/17 to 09/25/17 due to left basal ganglia infarct on MRI. CT revealed old anterior horn right internal capsule infarct, otherwise negative. MBSS 09/25/17 revealed delayed initiation of swallow at the pyriforms, reduced tongue base retraction, partial epiglottic inversation, partial anterior hyoid excursion and reduced laryngeal elevation. Penetration to the vocal folds with thin liquids with spontaneous cough to clear. Chin tuck ineffective, diffuse residue on tongue base, pharyngeal wall, valleculae and aryepiglottic folds. Oral  dysphagia with residue throughout mouth, disorganized chewing, sluggish tongue movement and labibal leakage. Mechanical soft diet with nectar thick liquids was recommended with small bites/sips, multiple dry swallows, alternate solids/liquids, check for pocketing.  Subjective: Pt seen in radiology for outpatient MBS. Daughter in attendance throughout the evaluation. Assessment / Plan / Recommendation CHL IP CLINICAL IMPRESSIONS 10/16/2017 Clinical Impression Pt presents with mild oropharyngeal dysphagia, characterized as follows: ORALLY, pt exhibits adequate bolus formation and posterior propulsion of liquids and puree. Oral prep of solids was extended, with piecemeal swallow and slight oral residue. Dry swallow effectively cleared oral residue. Premature spillage over the tongue base was noted across consistencies. PHARYNGEALLY, pt exhibits delayed swallow reflex across consistencies, with trigger at the pyriform sinus on thin and nectar thick liquids, and at the vallecular sinus on puree and solid consistencies. A delay of this significance increases risk for aspiration before the swallow on liquids. Intermittent (2 episodes), trace, flash penetration was seen on thin liquid via cup and straw (seen only once on each). Penetrate cleared completely and spontaneously, and was not aspirated. Multiple presentations were given of both cup and straw drinking. There was no post-swallow residue on any consistency. At this time, recommend continuing with soft moistened solids with thin liquids via cup or straw, Adherence to safe swallow precautions is critical. These were reviewed with pt and daughter, and provided to them in written form. Pt's daughter indicated an appointment with OP Speech Therapy this afternoon. Results of this assessment were relayed to primary SLP. Will defer ongoing recommendations for treatment to outpatient therapist. SLP Visit Diagnosis Dysphagia, oropharyngeal phase (R13.12)   Impact on safety and  function Mild aspiration risk   CHL IP TREATMENT RECOMMENDATION 10/16/2017 Treatment Recommendations Defer treatment plan to f/u with SLP   Prognosis 10/16/2017 Prognosis for Safe Diet Advancement Good CHL IP DIET RECOMMENDATION 10/16/2017 SLP Diet Recommendations Dysphagia 3 (Mech soft) solids, moistened Thin liquid Liquid Administration via Cup;Straw Medication Administration 1 at a time Compensations Minimize environmental distractions;Slow rate;Small sips/bites Postural Changes Seated upright at 90 degrees Remain semi-upright after after feeds/meals    CHL IP OTHER RECOMMENDATIONS 10/16/2017 Oral Care Recommendations Oral care QID   CHL IP FOLLOW UP RECOMMENDATIONS 10/16/2017 Follow up Recommendations Outpatient SLP        CHL IP ORAL PHASE 10/16/2017 Oral Phase Impaired Oral - Nectar Cup Premature spillage Oral - Thin Cup, Straw Premature spillage Oral - Puree Piecemeal swallowing;Premature spillage Oral - Mech Soft Weak lingual manipulation;Lingual/palatal residue;Piecemeal swallowing;Premature spillage  CHL IP PHARYNGEAL PHASE 10/16/2017 Pharyngeal Phase Impaired Pharyngeal- Nectar Cup Delayed swallow initiation-pyriform sinuses Pharyngeal- Thin Cup Delayed swallow initiation-pyriform sinuses;Reduced airway/laryngeal closure;Penetration/Aspiration during swallow Pharyngeal Material does not enter airway;Material enters airway, remains ABOVE vocal cords then ejected out Pharyngeal- Puree Delayed swallow initiation-vallecula Pharyngeal- Mechanical Soft Delayed swallow initiation-vallecula  CHL IP CERVICAL ESOPHAGEAL PHASE 10/16/2017 Cervical Esophageal Phase [facility] Celia B. Quentin Ore, [facility], [place] Speech Language Pathologist [phone] [place],  Fredirick Maudlin 10/16/2017, 2:04 PM               Assessment/Plan   ICD-10-CM   1. 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 Essential hypertension I10 amLODipine (NORVASC) 10 MG tablet  3.  Mixed hyperlipidemia E78.2 atorvastatin (LIPITOR) 40 MG tablet    Lipid Panel  4. Dysphagia due to recent cerebrovascular accident (CVA) I69.391   5. High risk medication use Z79.899 CMP with eGFR([facility])    CBC with Differential/Platelets    Continue current medications as ordered  Follow up with Speech therapy as scheduled  Will call with neurology referral  GET OLD RECORDS  Follow up in 3 mos for stroke, HTN and hyperlipidemia. Fasting labs prior to appt    [place] S. Perlie Gold  [facility] [address] [place], [place] [address] [phone] Cell (Monday-Friday 8 AM - 5 PM) [phone] After 5 PM and follow prompts

## 2017-10-24 NOTE — Patient Instructions (Addendum)
Continue current medications as ordered  Follow up with Speech therapy as scheduled  Will call with neurology referral  GET OLD RECORDS  Follow up in 3 mos for stroke, HTN and hyperlipidemia. Fasting labs prior to appt.

## 2017-11-03 ENCOUNTER — Ambulatory Visit: Payer: Medicare PPO | Admitting: Speech Pathology

## 2017-11-03 DIAGNOSIS — R471 Dysarthria and anarthria: Secondary | ICD-10-CM

## 2017-11-03 NOTE — Therapy (Signed)
[facility] [address] [place], [place], [address] Phone: [phone]   Fax:  [phone]  Speech Language Pathology Treatment  Patient Details  Name: Randall James MRN: [id number] Date of Birth: [birth year] Referring Provider: Newman Nipariss [facility]   Encounter Date: 11/03/2017  End of Session - 11/03/17 1107    Visit Number  6    Number of Visits  25    Date for SLP Re-Evaluation  12/23/17    SLP Start Time  0800    SLP Stop Time   0844    SLP Time Calculation (min)  44 min    Activity Tolerance  Patient tolerated treatment well       Past Medical History:  Diagnosis Date  . Hyperlipidemia   . Hypertension   . Stroke [facility] 2019    No past surgical history on file.  There were no vitals filed for this visit.  Subjective Assessment - 11/03/17 0803    Subjective  Pt enters with slurred and rapid rate of speech.    Patient is accompained by:  Family member daughter, wife    Currently in Pain?  No/denies            ADULT SLP TREATMENT - 11/03/17 0803      General Information   Behavior/Cognition  Alert;Cooperative;Pleasant mood    Patient Positioning  Upright in chair      Treatment Provided   Treatment provided  Cognitive-Linquistic      Dysphagia Treatment   Temperature Spikes Noted  --    Respiratory Status  --    Oral Cavity - Dentition  --      Pain Assessment   Pain Assessment  No/denies pain      Cognitive-Linquistic Treatment   Treatment focused on  Dysarthria    Skilled Treatment  Pt's wife, daughter report he speaks rapidly at home and that they provide frequent reminders to pt to slow down. Pt generated descriptions of common objects with 80% intelligibility, occasional min-mod A for intelligibility strategies and to repeat when speech was unintelligible. Pt made 3 self-corrections during structured tasks, generally unaware of faster rate during conversational asides between  tasks. SLP encouraged pt's wife/daughter to come up with a non-verbal signal to give pt an opportunity to correct prior to cuing him verbally.       Assessment / Recommendations / Plan   Plan  Continue with current plan of care      Progression Toward Goals   Progression toward goals  Progressing toward goals       SLP Education - 11/03/17 1107    Education provided  Yes    Education Details  non-verbal cue for pt to self-correct    Person(s) Educated  Patient;Spouse;Child(ren)    Methods  Explanation    Comprehension  Verbalized understanding       SLP Short Term Goals - 11/03/17 0831      SLP SHORT TERM GOAL #1   Title  Pt will follow swallow precautions with mod I over 2 sessions and per pt/family report    Time  3    Period  Weeks    Status  On-going      SLP SHORT TERM GOAL #2   Title  Pt will perform HEP for dysphagia and dysarthria with rare min A over 2 sessions    Time  3    Period  Weeks    Status  On-going  SLP SHORT TERM GOAL #3   Title  Pt will utilize compensations for dysarthria with rare min A in simple conversation for 12 minutes over 2 sessions    Time  3    Period  Weeks    Status  On-going      SLP SHORT TERM GOAL #4   Title  Pt/family will verbalize s/s of aspiration pna with rare min A    Time  3    Period  Weeks    Status  On-going      SLP SHORT TERM GOAL #5   Title  Pt will complete repeat objective swallow study    Status  Achieved       SLP Long Term Goals - 11/03/17 1108      SLP LONG TERM GOAL #1   Title  Pt will follow swallow precautions independently over 2 sessions    Time  9    Period  Weeks    Status  On-going      SLP LONG TERM GOAL #2   Title  Pt will tolerate mechanical soft solids (Dysphagia 3) and thin liquids with independent use of compensations    Time  9    Period  Weeks    Status  On-going      SLP LONG TERM GOAL #3   Title  Pt will utilize compensations for dysarthria to be 95% intellgilble in noisy  environment over 15 minute conversation with rare min A    Time  9    Period  Weeks    Status  On-going       Plan - 11/03/17 1108    Clinical Impression Statement  Mr. Sawyers presents today with 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 His self-monitoring skills for decr'd intelligibility are improving however he does not always note his errors and attempt to repeat. Assistance continues as necessary today for use of dysarthria compensations. I recommend cont'd skilled ST to maximize intelligibility and for training in swallow precautions to ensure safe diet advancement to improve nutrition/hydration and QOL.    Speech Therapy Frequency  2x / week    Treatment/Interventions  Aspiration precaution training;Environmental controls;Cueing hierarchy;Oral motor exercises;SLP instruction and feedback;Compensatory strategies;Functional tasks;Diet toleration management by SLP;Trials of upgraded texture/liquids;Internal/external aids;Patient/family education    Potential to Achieve Goals  Good    Potential Considerations  Severity of impairments    Consulted and Agree with Plan of Care  Patient;Family member/caregiver    Family Member Consulted  wife, Nelva Bush       Patient will benefit from skilled therapeutic intervention in order to improve the following deficits and impairments:   Dysarthria and anarthria    Problem List Patient Active Problem List   Diagnosis Date Noted  . 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 Essential hypertension 10/24/2017  . Mixed hyperlipidemia 10/24/2017  . Dysphagia due to recent cerebrovascular accident (CVA) 10/24/2017   Rondel Baton, MS, CCC-SLP Speech-Language Pathologist  Arlana Lindau 11/03/2017, 11:09 AM  [facility] [address] [place], [place], [address] Phone: [phone]   Fax:  [phone]   Name: Randall James MRN:  [id number] Date of Birth: [birth year]

## 2017-11-04 ENCOUNTER — Ambulatory Visit: Payer: Medicare PPO

## 2017-11-04 DIAGNOSIS — R471 Dysarthria and anarthria: Secondary | ICD-10-CM | POA: Diagnosis not present

## 2017-11-04 DIAGNOSIS — R1312 Dysphagia, oropharyngeal phase: Secondary | ICD-10-CM

## 2017-11-04 NOTE — Patient Instructions (Signed)
  Please complete the assigned speech therapy homework prior to your next session and return it to the speech therapist at your next visit.  

## 2017-11-04 NOTE — Therapy (Signed)
[facility] [address] [place], [place], [address] Phone: [phone]   Fax:  [phone]  Speech Language Pathology Treatment  Patient Details  Name: Randall ParkerRobert Jerome Speckman MRN: [id number] Date of Birth: 10-12-1950 Referring Provider: Newman N[facility]ariss [facility]   Encounter Date: 11/04/2017  End of Session - 11/04/17 0919    Visit Number  7    Number of Visits  25    Date for SLP Re-Evaluation  12/23/17    SLP Start Time  0806    SLP Stop Time   0846    SLP Time Calculation (min)  40 min    Activity Tolerance  Patient tolerated treatment well       Past Medical History:  Diagnosis Date  . Hyperlipidemia   . Hypertension   . Stroke [facility](HCC) 2019    History reviewed. No pertinent surgical history.  There were no vitals filed for this visit.  Subjective Assessment - 11/04/17 0808    Subjective  Pt enters today with fast speech rate.    Patient is accompained by:  Family member wife    Currently in Pain?  No/denies            ADULT SLP TREATMENT - 11/04/17 0809      General Information   Behavior/Cognition  Alert;Cooperative;Pleasant mood      Treatment Provided   Treatment provided  Cognitive-Linquistic      Dysphagia Treatment   Other treatment/comments  Min A occasionally was necessary for swallowing HEP.       Pain Assessment   Pain Assessment  No/denies pain      Cognitive-Linquistic Treatment   Treatment focused on  Dysarthria    Skilled Treatment  In order to habitualize slower rate of speech and improve pt's speech intelligibility SLP had pt organize 6 and 8 step sequences and describe each picture. Initially pt req'd min-mod cueing to reduce rate and overarticulate usually, but faded over the course of the task (20 minutes) to occasional min A. Pt self-corrected on sequencing the cards in the correct order x3.      Assessment / Recommendations / Plan   Plan  Continue with current plan of care       Dysphagia Recommendations   Diet recommendations  Dysphagia 3 (mechanical soft);Thin liquid    Liquids provided via  Cup;Straw    Medication Administration  -- one at a time whole with liquid or puree    Supervision  Patient able to self feed    Compensations  Slow rate;Small sips/bites;Follow solids with liquid      Progression Toward Goals   Progression toward goals  Progressing toward goals       SLP Education - 11/04/17 0918    Education provided  Yes    Education Details  slowed speech rate, overarticulation    Person(s) Educated  Patient;Spouse    Methods  Explanation    Comprehension  Verbalized understanding       SLP Short Term Goals - 11/04/17 0922      SLP SHORT TERM GOAL #1   Title  Pt will follow swallow precautions with mod I over 2 sessions and per pt/family report    Time  3    Period  Weeks    Status  On-going      SLP SHORT TERM GOAL #2   Title  Pt will perform HEP for dysphagia and dysarthria with rare min A over 2 sessions    Time  3    Period  Weeks    Status  On-going      SLP SHORT TERM GOAL #3   Title  Pt will utilize compensations for dysarthria with rare min A in simple conversation for 12 minutes over 2 sessions    Time  3    Period  Weeks    Status  On-going      SLP SHORT TERM GOAL #4   Title  Pt/family will verbalize s/s of aspiration pna with rare min A    Time  3    Period  Weeks    Status  On-going      SLP SHORT TERM GOAL #5   Title  Pt will complete repeat objective swallow study    Status  Achieved       SLP Long Term Goals - 11/04/17 1610      SLP LONG TERM GOAL #1   Title  Pt will follow swallow precautions independently over 2 sessions    Time  9    Period  Weeks    Status  On-going      SLP LONG TERM GOAL #2   Title  Pt will tolerate mechanical soft solids (Dysphagia 3) and thin liquids with independent use of compensations    Time  9    Period  Weeks    Status  On-going      SLP LONG TERM GOAL #3   Title   Pt will utilize compensations for dysarthria to be 95% intellgilble in noisy environment over 15 minute conversation with rare min A    Time  9    Period  Weeks    Status  On-going       Plan - 11/04/17 0919    Clinical Impression Statement  Mr. 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 His self-monitoring skills for decr'd intelligibility continue to improve however he continues to require cues for awareness of his errors. Assistance continues as necessary today for use of dysarthria compensations instructured tasks and moreso in simple conversation. Swallowing HEP was completed today with min A occasionally. I recommend cont'd skilled ST to maximize intelligibility and for training in swallow precautions to ensure safe diet advancement to improve nutrition/hydration and QOL.    Speech Therapy Frequency  2x / week    Treatment/Interventions  Aspiration precaution training;Environmental controls;Cueing hierarchy;Oral motor exercises;SLP instruction and feedback;Compensatory strategies;Functional tasks;Diet toleration management by SLP;Trials of upgraded texture/liquids;Internal/external aids;Patient/family education    Potential to Achieve Goals  Good    Potential Considerations  Severity of impairments    Consulted and Agree with Plan of Care  Patient;Family member/caregiver    Family Member Consulted  wife, Nelva Bush       Patient will benefit from skilled therapeutic intervention in order to improve the following deficits and impairments:   Dysarthria and anarthria  Dysphagia, oropharyngeal phase    Problem List Patient Active Problem List   Diagnosis Date Noted  . 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 Essential hypertension 10/24/2017  . Mixed hyperlipidemia 10/24/2017  . Dysphagia due to recent cerebrovascular accident (CVA) 10/24/2017    [facility] ,MS, CCC-SLP  11/04/2017, 9:23 AM  [facility]  [facility] [address] [place], [place], [address] Phone: [phone]   Fax:  [phone]   Name: Dannon Nguyenthi MRN: [id number] Date of Birth: 06-01-1950

## 2017-11-18 ENCOUNTER — Ambulatory Visit: Payer: Medicare PPO | Attending: Family Medicine

## 2017-11-18 DIAGNOSIS — R1312 Dysphagia, oropharyngeal phase: Secondary | ICD-10-CM | POA: Diagnosis present

## 2017-11-18 DIAGNOSIS — R471 Dysarthria and anarthria: Secondary | ICD-10-CM | POA: Diagnosis not present

## 2017-11-18 NOTE — Therapy (Signed)
[facility] [address] [place], [place], [address] Phone: [phone]   Fax:  [phone]  Speech Language Pathology Treatment  Patient Details  Name: Randall ParkerRobert Jerome James MRN: [id number] Date of Birth: [birth year] Referring Provider: Newman Nipariss [facility]   Encounter Date: 11/18/2017  End of Session - 11/18/17 0847    Visit Number  8    Number of Visits  25    Date for SLP Re-Evaluation  12/23/17    SLP Start Time  0804    SLP Stop Time   0846    SLP Time Calculation (min)  42 min    Activity Tolerance  Patient tolerated treatment well       Past Medical History:  Diagnosis Date  . Hyperlipidemia   . Hypertension   . Stroke [facility](HCC) 2019    History reviewed. No pertinent surgical history.  There were no vitals filed for this visit.  Subjective Assessment - 11/18/17 0808    Subjective  "(My speech) is good when I remember to slow it down."    Patient is accompained by:  Family member wife    Currently in Pain?  No/denies            ADULT SLP TREATMENT - 11/18/17 0809      General Information   Behavior/Cognition  Alert;Cooperative;Pleasant mood      Treatment Provided   Treatment provided  Cognitive-Linquistic      Dysphagia Treatment   Temperature Spikes Noted  No    Respiratory Status  Room air    Oral Cavity - Dentition  Dentures, bottom;Dentures, top    Treatment Methods  Skilled observation;Upgraded PO texture trial;Compensation strategy training;Patient/caregiver education    Patient observed directly with PO's  Yes    Type of PO's observed  Regular;Thin liquids    Oral Phase Signs & Symptoms  Prolonged mastication;Prolonged bolus formation possible premature spillage when distracted/talking    Other treatment/comments  Pt tells SLP that he "used to have to pay attention to when I ate or I'd get choked," and now pt does not demo overt s/s difficulty to the same sxtent - he does not have to pay  as close of attention with meals. SLP assessed pt's procedure with his dysphagia HEP; he was independent. He stated he was having drier items (popcorn, peanut butter crackers, chips) and has little difficulty if he has a liquid wash. SLP assessed pt with peanut butter cracker and water today - pt with what appeared to be premature spillage over base of tongue when he was talking. He told SLP that he needed to stop talking and only eat. Pt without overt s/s aspiration PNA.  Meds: pt is taking one pill if larger but if not, is taking two at a time, reportedly without difficulty.      Cognitive-Linquistic Treatment   Treatment focused on  Dysarthria    Skilled Treatment  SLP used nonverbal and verbal cues to improve pt's use of dysarthria compensations in conversation during the session when pt was talking in mod complex conversation. Pt self corrected x2, SLP asked pt for repeats x4.       Assessment / Recommendations / Plan   Plan  Continue with current plan of care      Dysphagia Recommendations   Diet recommendations  Dysphagia 3 (mechanical soft);Regular    Liquids provided via  Cup;Straw    Medication Administration  -- pt is taking one if larger, but is taking two if  smaller    Supervision  Patient able to self feed    Compensations  Slow rate;Small sips/bites;Follow solids with liquid no distractions/talking during snacks and meals      Progression Toward Goals   Progression toward goals  Progressing toward goals       SLP Education - 11/18/17 0847    Education provided  Yes    Education Details  egg timer (external cue) for not talking when eating/drinking, precautions when eating, cues to slow rate    Person(s) Educated  Patient daughter   daughter   Methods  Explanation;Verbal cues    Comprehension  Verbal cues required;Verbalized understanding;Returned demonstration       SLP Short Term Goals - 11/18/17 1652      SLP SHORT TERM GOAL #1   Title  Pt will follow swallow  precautions with mod I over 2 sessions and per pt/family report    Baseline  11-18-17    Time  2    Period  Weeks    Status  On-going      SLP SHORT TERM GOAL #2   Title  Pt will perform HEP for dysphagia and dysarthria with rare min A over 2 sessions    Time  2    Period  Weeks    Status  On-going      SLP SHORT TERM GOAL #3   Title  Pt will utilize compensations for dysarthria with rare min A in simple conversation for 12 minutes over 2 sessions    Time  2    Period  Weeks    Status  On-going      SLP SHORT TERM GOAL #4   Title  Pt/family will verbalize s/s of aspiration pna with rare min A    Time  2    Period  Weeks    Status  On-going      SLP SHORT TERM GOAL #5   Title  Pt will complete repeat objective swallow study    Status  Achieved       SLP Long Term Goals - 11/18/17 1652      SLP LONG TERM GOAL #1   Title  Pt will follow swallow precautions independently over 2 sessions    Baseline  11-18-17    Time  8    Period  Weeks    Status  On-going      SLP LONG TERM GOAL #2   Title  Pt will tolerate mechanical soft solids (Dysphagia 3) and thin liquids with independent use of compensations    Time  8    Period  Weeks    Status  On-going      SLP LONG TERM GOAL #3   Title  Pt will utilize compensations for dysarthria to be 95% intellgilble in noisy environment over 15 minute conversation with rare min A    Time  8    Period  Weeks    Status  On-going       Plan - 11/18/17 1650    Clinical Impression Statement  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 His self-monitoring skills for decr'd intelligibility are improving however he does not always note his errors and attempt to repeat. Assistance continues necessary for use of dysarthria compensations in conversation and less so in structured tasks. I recommend cont'd skilled ST  to maximize intelligibility  and for training in swallow precautions to ensure safe diet advancement to improve nutrition/hydration and QOL.    Speech Therapy Frequency  2x / week    Duration  -- 12 weeks or 25 total visits    Treatment/Interventions  Aspiration precaution training;Environmental controls;Cueing hierarchy;Oral motor exercises;SLP instruction and feedback;Compensatory strategies;Functional tasks;Diet toleration management by SLP;Trials of upgraded texture/liquids;Internal/external aids;Patient/family education    Potential to Achieve Goals  Good    Potential Considerations  Severity of impairments    Consulted and Agree with Plan of Care  Patient;Family member/caregiver    Family Member Consulted  wife, Nelva Bush       Patient will benefit from skilled therapeutic intervention in order to improve the following deficits and impairments:   Dysarthria and anarthria  Dysphagia, oropharyngeal phase    Problem List Patient Active Problem List   Diagnosis Date Noted  . 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 Essential hypertension 10/24/2017  . Mixed hyperlipidemia 10/24/2017  . Dysphagia due to recent cerebrovascular accident (CVA) 10/24/2017    [facility] ,MS, CCC-SLP  11/18/2017, 4:54 PM  [facility] [address] [place], [place], [address] Phone: [phone]   Fax:  [phone]   Name: Brasen Bundren MRN: [id number] Date of Birth: 24-Feb-1951

## 2017-11-18 NOTE — Patient Instructions (Signed)
   Continue to do exercises for swallowing; you are not there yet, but are making progress. NO TALKING when eating or drinking. You may want to try a egg timer with a sound, to cue you to remember to stay quiet.

## 2017-11-21 ENCOUNTER — Ambulatory Visit: Payer: Medicare PPO

## 2017-11-24 ENCOUNTER — Ambulatory Visit: Payer: Medicare PPO | Admitting: Speech Pathology

## 2017-11-24 DIAGNOSIS — R471 Dysarthria and anarthria: Secondary | ICD-10-CM | POA: Diagnosis not present

## 2017-11-24 NOTE — Therapy (Signed)
[facility] [address] [place], [place], [address] Phone: [phone]   Fax:  [phone]  Speech Language Pathology Treatment  Patient Details  Name: Randall James MRN: [id number] Date of Birth: 17-Dec-1950 Referring Provider: Sherlynn Carbon [facility]   Encounter Date: 11/24/2017  End of Session - 11/24/17 0848    Visit Number  9    Number of Visits  25    Date for SLP Re-Evaluation  12/23/17    SLP Start Time  0802    SLP Stop Time   0844    SLP Time Calculation (min)  42 min    Activity Tolerance  Patient tolerated treatment well       Past Medical History:  Diagnosis Date  . Hyperlipidemia   . Hypertension   . Stroke [facility]) 2019    No past surgical history on file.  There were no vitals filed for this visit.  Subjective Assessment - 11/24/17 0803    Subjective  "We had an appointment on Friday but nobody was here."    Patient is accompained by:  Family member wife and daughter    Currently in Pain?  No/denies            ADULT SLP TREATMENT - 11/24/17 0802      General Information   Behavior/Cognition  Alert;Cooperative;Pleasant mood    Patient Positioning  Upright in chair      Treatment Provided   Treatment provided  Dysphagia;Cognitive-Linquistic      Dysphagia Treatment   Temperature Spikes Noted  No    Respiratory Status  Room air    Treatment Methods  Skilled observation;Upgraded PO texture trial;Compensation strategy training;Patient/caregiver education    Patient observed directly with PO's  Yes    Type of PO's observed  Thin liquids    Feeding  Able to feed self    Liquids provided via  Cup    Pharyngeal Phase Signs & Symptoms  -- none    Amount of cueing  Modified independent    Other treatment/comments  Pt demo'd HEP for dysarthria/dysphagia with rare min A; pt required encouragement to complete 25 repetitions of effortful swallow. Wife reports she is encouraging pt to  complete repetitions at home. Pt recalled 2 signs of aspiration PNA; SLP educated re: additional signs and pt recalled these were on his handout at home. Pt/wife report he is adhering to swallow precautions at home; wife reports improvements in pt refraining from talking while eating. No overt signs of aspiration today with thin liquids; pt demo'ing precautions with modified independence.      Pain Assessment   Pain Assessment  No/denies pain      Cognitive-Linquistic Treatment   Treatment focused on  Dysarthria    Skilled Treatment  Occasional mod A required in 12 minutes conversation for use of dysarthria compensations; SLP noted when pt excited about a subject, rate increases with negative impact on intelligibility. Repeats requested x5, with pt self-correcting on one occasion. 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       Assessment / Recommendations / Plan   Plan  Continue with current plan of care      Dysphagia Recommendations   Diet recommendations  Dysphagia 3 (mechanical soft);Thin liquid    Liquids provided via  Cup;Straw    Medication Administration  -- pt taking larger pills one at a time,  2 if smaller    Supervision  Patient able to self feed    Compensations  Slow rate;Small sips/bites;Follow solids with liquid reduce distractions at mealtimes (no talking)      Progression Toward Goals   Progression toward goals  Progressing toward goals         SLP Short Term Goals - 11/24/17 0808      SLP SHORT TERM GOAL #1   Title  Pt will follow swallow precautions with mod I over 2 sessions and per pt/family report    Baseline  11-18-17, 11-24-17    Time  1    Period  Weeks    Status  Achieved      SLP SHORT TERM GOAL #2   Title  Pt will perform HEP for dysphagia and dysarthria with rare min A over 2 sessions    Baseline  11/24/17    Time  1    Status   Partially Met      SLP SHORT TERM GOAL #3   Title  Pt will utilize compensations for dysarthria with rare min A in simple conversation for 12 minutes over 2 sessions    Time  1    Period  Weeks    Status  Not Met      SLP SHORT TERM GOAL #4   Title  Pt/family will verbalize s/s of aspiration pna with rare min A    Baseline  2 signs 11/24/17    Time  1    Period  Weeks    Status  Partially Met      SLP SHORT TERM GOAL #5   Title  Pt will complete repeat objective swallow study    Status  Achieved       SLP Long Term Goals - 11/24/17 0848      SLP LONG TERM GOAL #1   Title  Pt will follow swallow precautions independently over 2 sessions    Time  7    Period  Weeks    Status  On-going      SLP LONG TERM GOAL #2   Title  Pt will tolerate mechanical soft solids (Dysphagia 3) and thin liquids with independent use of compensations    Time  7    Period  Weeks    Status  On-going      SLP LONG TERM GOAL #3   Title  Pt will utilize compensations for dysarthria to be 95% intellgilble in noisy environment over 15 minute conversation with rare min A    Time  7    Period  Weeks    Status  On-going       Plan - 11/24/17 0905    Clinical Impression Statement  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 His self-monitoring skills for decr'd intelligibility are improving however he does not always note his errors and attempt to repeat. Assistance continues necessary for use of dysarthria compensations in conversation and less so in structured tasks. I recommend cont'd skilled ST to maximize intelligibility and for training in swallow precautions to ensure safe diet advancement to improve nutrition/hydration and QOL.    Speech Therapy Frequency  2x / week    Treatment/Interventions  Aspiration precaution training;Environmental controls;Cueing  hierarchy;Oral motor exercises;SLP instruction and feedback;Compensatory strategies;Functional tasks;Diet toleration management by SLP;Trials of upgraded texture/liquids;Internal/external aids;Patient/family education    Potential to  Achieve Goals  Good    Potential Considerations  Severity of impairments    Consulted and Agree with Plan of Care  Patient;Family member/caregiver    Family Member Consulted  wife, Constance Holster, daughter       Patient will benefit from skilled therapeutic intervention in order to improve the following deficits and impairments:   Dysarthria and anarthria  Dysphagia, oropharyngeal phase    Problem List Patient Active Problem List   Diagnosis Date Noted  . 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 Essential hypertension 10/24/2017  . Mixed hyperlipidemia 10/24/2017  . Dysphagia due to recent cerebrovascular accident (CVA) 10/24/2017   Deneise Lever, [place], [place] E Myrene Bougher 11/24/2017, 9:06 AM  [facility] [address] [place], [place], [address] Phone: [phone]   Fax:  [phone]   Name: Randall James MRN: [id number] Date of Birth: October 25, 1950

## 2017-11-27 ENCOUNTER — Ambulatory Visit: Payer: Medicare PPO | Admitting: Speech Pathology

## 2017-11-27 DIAGNOSIS — R471 Dysarthria and anarthria: Secondary | ICD-10-CM | POA: Diagnosis not present

## 2017-11-27 DIAGNOSIS — R1312 Dysphagia, oropharyngeal phase: Secondary | ICD-10-CM

## 2017-11-27 NOTE — Therapy (Signed)
[facility] [address] [place], [place], [address] Phone: [phone]   Fax:  [phone]  Speech Language Pathology Treatment  Patient Details  Name: Randall James MRN: [id number] Date of Birth: Oct 07, 1950 Referring Provider: Sherlynn Carbon [facility]   Encounter Date: 11/27/2017   Speech Therapy Progress Note  Dates of Reporting Period: 09/30/17 to 11/27/17 Pt has been seen for 10 speech therapy visits targeting dysarthria and dysphagia. Plan: Continue plan as written    End of Session - 11/27/17 1123    Visit Number  10    Number of Visits  25    Date for SLP Re-Evaluation  12/23/17    SLP Start Time  0804    SLP Stop Time   0867    SLP Time Calculation (min)  39 min    Activity Tolerance  Patient tolerated treatment well       Past Medical History:  Diagnosis Date  . Hyperlipidemia   . Hypertension   . Stroke [facility]) 2019    No past surgical history on file.  There were no vitals filed for this visit.  Subjective Assessment - 11/27/17 0805    Subjective  "Brought my lesson."    Patient is accompained by:  Family member daughter    Currently in Pain?  No/denies            ADULT SLP TREATMENT - 11/27/17 0804      General Information   Behavior/Cognition  Alert;Cooperative;Pleasant mood    Patient Positioning  Upright in chair      Treatment Provided   Treatment provided  Cognitive-Linquistic      Pain Assessment   Pain Assessment  No/denies pain      Cognitive-Linquistic Treatment   Treatment focused on  Dysarthria    Skilled Treatment  Pt used slow rate, pausing with 85% intelligibility for paragraph level reading with, SLP requesting 2 repeats and pt self correcting on 5 occasions. SLP drew pt's awareness to emphasizing initial and final consonant sounds, highlighting text to reinforce. Pt with significant improvement in intelligibility when focusing on this strategy and was able to  extend into subsequent task with rare min A. 20 minutes conversation with occasional min A for dysarthria compensations, pt self corrected on 6 occasions, with SLP requesting one repeat.      Assessment / Recommendations / Plan   Plan  Continue with current plan of care      Progression Toward Goals   Progression toward goals  Progressing toward goals       SLP Education - 11/27/17 1123    Education provided  Yes    Education Details   focus on articulating initial and final consonant sounds    Person(s) Educated  Patient;Child(ren)    Methods  Explanation;Demonstration;Verbal cues;Handout    Comprehension  Verbalized understanding;Returned demonstration;Need further instruction       SLP Short Term Goals - 11/27/17 0805      SLP SHORT TERM GOAL #1   Title  Pt will follow swallow precautions with mod I over 2 sessions and per pt/family report    Baseline  11-18-17, 11-24-17    Status  Achieved      SLP SHORT TERM GOAL #2   Title  Pt will perform HEP for dysphagia and dysarthria with rare min A over 2 sessions    Status  Partially Met      SLP SHORT TERM GOAL #3   Title  Pt will utilize  compensations for dysarthria with rare min A in simple conversation for 12 minutes over 2 sessions    Status  Not Met      SLP SHORT TERM GOAL #4   Title  Pt/family will verbalize s/s of aspiration pna with rare min A    Status  Partially Met      SLP SHORT TERM GOAL #5   Title  Pt will complete repeat objective swallow study    Status  Achieved       SLP Long Term Goals - 11/27/17 0806      SLP LONG TERM GOAL #1   Title  Pt will follow swallow precautions independently over 2 sessions    Baseline  11-18-17    Time  7    Period  Weeks    Status  On-going      SLP LONG TERM GOAL #2   Title  Pt will tolerate mechanical soft solids (Dysphagia 3) and thin liquids with independent use of compensations    Time  7    Period  Weeks    Status  On-going      SLP LONG TERM GOAL #3   Title  Pt  will utilize compensations for dysarthria to be 95% intellgilble in noisy environment over 15 minute conversation with rare min A    Time  7    Period  Weeks    Status  On-going       Plan - 11/27/17 1123    Clinical Impression Statement  Mr. 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 His self-monitoring skills for decr'd intelligibility are improving however he does not always note his errors and attempt to repeat. Assistance continues necessary for use of dysarthria compensations in conversation and less so in structured tasks. I recommend cont'd skilled ST to maximize intelligibility and for training in swallow precautions to ensure safe diet advancement to improve nutrition/hydration and QOL.    Speech Therapy Frequency  2x / week    Treatment/Interventions  Aspiration precaution training;Environmental controls;Cueing hierarchy;Oral motor exercises;SLP instruction and feedback;Compensatory strategies;Functional tasks;Diet toleration management by SLP;Trials of upgraded texture/liquids;Internal/external aids;Patient/family education    Potential to Achieve Goals  Good    Potential Considerations  Severity of impairments    Consulted and Agree with Plan of Care  Patient;Family member/caregiver    Family Member Consulted  daughter       Patient will benefit from skilled therapeutic intervention in order to improve the following deficits and impairments:   Dysarthria and anarthria  Dysphagia, oropharyngeal phase    Problem List Patient Active Problem List   Diagnosis Date Noted  . 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 Essential hypertension 10/24/2017  . Mixed hyperlipidemia 10/24/2017  . Dysphagia due to recent cerebrovascular accident (CVA) 10/24/2017   Deneise Lever, [place], [place] Speech-Language Pathologist  Aliene Altes 11/27/2017, 11:25 AM  [facility] [address] [place], [place], [address] Phone: [phone]   Fax:  [phone]   Name: Sean Malinowski MRN: [id number] Date of Birth: June 20, 1950

## 2017-12-01 ENCOUNTER — Ambulatory Visit: Payer: Medicare PPO | Admitting: Speech Pathology

## 2017-12-01 DIAGNOSIS — R471 Dysarthria and anarthria: Secondary | ICD-10-CM

## 2017-12-01 NOTE — Therapy (Signed)
[facility] [address] [place], [place], [address] Phone: [phone]   Fax:  [phone]  Speech Language Pathology Treatment  Patient Details  Name: Randall James MRN: [id number] Date of Birth: 06/20/1950 Referring Provider: Sherlynn Carbon [facility]   Encounter Date: 12/01/2017  End of Session - 12/01/17 0807    Visit Number  11    Number of Visits  25    Date for SLP Re-Evaluation  12/23/17    SLP Start Time  0803    SLP Stop Time   0845    SLP Time Calculation (min)  42 min    Activity Tolerance  Patient tolerated treatment well       Past Medical History:  Diagnosis Date  . Hyperlipidemia   . Hypertension   . Stroke [facility]) 2019    No past surgical history on file.  There were no vitals filed for this visit.  Subjective Assessment - 12/01/17 0803    Subjective  "I can tell when I get too fast."    Patient is accompained by:  Family member wife    Currently in Pain?  No/denies            ADULT SLP TREATMENT - 12/01/17 0803      General Information   Behavior/Cognition  Alert;Cooperative;Pleasant mood      Treatment Provided   Treatment provided  Cognitive-Linquistic      Cognitive-Linquistic Treatment   Treatment focused on  Dysarthria    Skilled Treatment  SLP worked with pt on dysarthria compensations during simple multiparagraph reading. Pt makes self-corrections of 13 misarticulations/unclear speech, requires verbal cues to do so on 3 occasions. Pt with inhalatory stridor, short rushes of speech. Wife's perceived intelligibility was 70%. SLP demonstrated increasingly slowed rate, with pausing for more frequent breaths. Pt required frequent min-mod A to persist with strategies due to accelerating rate of speech after 3-4 breath groups during reading task. Wife demo'd appropriate cuing "slow down" and "breathe", and reported "I understand everything you said," when pt using these techniques.       Assessment / Recommendations / Plan   Plan  Continue with current plan of care      Progression Toward Goals   Progression toward goals  Progressing toward goals         SLP Short Term Goals - 12/01/17 4944      SLP SHORT TERM GOAL #1   Title  Pt will follow swallow precautions with mod I over 2 sessions and per pt/family report    Status  Achieved      SLP SHORT TERM GOAL #2   Title  Pt will perform HEP for dysphagia and dysarthria with rare min A over 2 sessions    Status  Partially Met      SLP SHORT TERM GOAL #3   Title  Pt will utilize compensations for dysarthria with rare min A in simple conversation for 12 minutes over 2 sessions    Status  Not Met      SLP SHORT TERM GOAL #4   Title  Pt/family will verbalize s/s of aspiration pna with rare min A    Status  Partially Met      SLP SHORT TERM GOAL #5   Title  Pt will complete repeat objective swallow study    Status  Achieved       SLP Long Term Goals - 12/01/17 9675      SLP LONG TERM GOAL #  1   Title  Pt will follow swallow precautions independently over 2 sessions    Baseline  11-18-17    Time  6    Period  Weeks    Status  On-going      SLP LONG TERM GOAL #2   Title  Pt will tolerate mechanical soft solids (Dysphagia 3) and thin liquids with independent use of compensations    Time  6    Period  Weeks    Status  On-going      SLP LONG TERM GOAL #3   Title  Pt will utilize compensations for dysarthria to be 95% intellgilble in noisy environment over 15 minute conversation with rare min A    Time  6    Period  Weeks    Status  On-going       Plan - 12/01/17 1315    Clinical Impression Statement  Mr. 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 His self-monitoring skills for decr'd intelligibility are improving however he does not always note his errors and attempt to repeat. Assistance continues necessary for use of dysarthria compensations in  conversation and less so in structured tasks. I recommend cont'd skilled ST to maximize intelligibility and for training in swallow precautions to ensure safe diet advancement to improve nutrition/hydration and QOL.    Speech Therapy Frequency  2x / week    Treatment/Interventions  Aspiration precaution training;Environmental controls;Cueing hierarchy;Oral motor exercises;SLP instruction and feedback;Compensatory strategies;Functional tasks;Diet toleration management by SLP;Trials of upgraded texture/liquids;Internal/external aids;Patient/family education    Potential to Achieve Goals  Good    Potential Considerations  Severity of impairments       Patient will benefit from skilled therapeutic intervention in order to improve the following deficits and impairments:   Dysarthria and anarthria    Problem List Patient Active Problem List   Diagnosis Date Noted  . 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 Essential hypertension 10/24/2017  . Mixed hyperlipidemia 10/24/2017  . Dysphagia due to recent cerebrovascular accident (CVA) 10/24/2017   Deneise Lever, [place], [place] 12/01/2017, 1:16 PM  [facility] [address] [place], [place], [address] Phone: [phone]   Fax:  [phone]   Name: Miner Koral MRN: [id number] Date of Birth: 09-27-1950

## 2017-12-04 ENCOUNTER — Ambulatory Visit: Payer: Medicare PPO | Admitting: Speech Pathology

## 2017-12-04 DIAGNOSIS — R471 Dysarthria and anarthria: Secondary | ICD-10-CM

## 2017-12-04 NOTE — Therapy (Signed)
[facility] [address] [place], [place], [address] Phone: [phone]   Fax:  [phone]  Speech Language Pathology Treatment  Patient Details  Name: Randall James MRN: [id number] Date of Birth: 05-17-1951 Referring Provider: Sherlynn Carbon [facility]   Encounter Date: 12/04/2017  End of Session - 12/04/17 0828    Visit Number  12    Number of Visits  25    Date for SLP Re-Evaluation  12/23/17    SLP Start Time  0804    SLP Stop Time   0845    SLP Time Calculation (min)  41 min    Activity Tolerance  Patient tolerated treatment well       Past Medical History:  Diagnosis Date  . Hyperlipidemia   . Hypertension   . Stroke [facility]) 2019    No past surgical history on file.  There were no vitals filed for this visit.  Subjective Assessment - 12/04/17 0805    Subjective  "I don't notice it at certain times" re: talking fast    Patient is accompained by:  -- Son-in-law    Currently in Pain?  Yes    Pain Score  3     Pain Location  Shoulder    Pain Orientation  Right    Pain Descriptors / Indicators  Aching;Sore    Pain Onset  1 to 4 weeks ago    Pain Frequency  Intermittent    Aggravating Factors   sleeping on it wrong            ADULT SLP TREATMENT - 12/04/17 0804      General Information   Behavior/Cognition  Alert;Cooperative;Pleasant mood      Treatment Provided   Treatment provided  Cognitive-Linquistic      Cognitive-Linquistic Treatment   Treatment focused on  Dysarthria    Skilled Treatment  SLP worked with pt on intelligibility and compensations for dysarthria. In 14 minutes multiparagraph reading, pt used compensations for dysarthria 90% of the time, self-monitoring and correcting independently in 80% of opportunities. Pt's son-in-law reported 100% intelligibility for reading. Immediately after reading, pt asked his son-in-law a question rapidly, which was not intelligible. SLP worked on  carryover of compensations in spontaneous, conversational speech activity with descriptive word game. Pt monitored for use of strategies 95% of the time with subtle nonverbal cues.       Assessment / Recommendations / Plan   Plan  Continue with current plan of care      Progression Toward Goals   Progression toward goals  Progressing toward goals         SLP Short Term Goals - 12/04/17 0829      SLP SHORT TERM GOAL #1   Title  Pt will follow swallow precautions with mod I over 2 sessions and per pt/family report    Status  Achieved      SLP SHORT TERM GOAL #2   Title  Pt will perform HEP for dysphagia and dysarthria with rare min A over 2 sessions    Status  Partially Met      SLP SHORT TERM GOAL #3   Title  Pt will utilize compensations for dysarthria with rare min A in simple conversation for 12 minutes over 2 sessions    Status  Not Met      SLP SHORT TERM GOAL #4   Title  Pt/family will verbalize s/s of aspiration pna with rare min A    Status  Partially Met      SLP SHORT TERM GOAL #5   Title  Pt will complete repeat objective swallow study    Status  Achieved       SLP Long Term Goals - 12/04/17 0829      SLP LONG TERM GOAL #1   Title  Pt will follow swallow precautions independently over 2 sessions    Time  6    Period  Weeks    Status  On-going      SLP LONG TERM GOAL #2   Title  Pt will tolerate mechanical soft solids (Dysphagia 3) and thin liquids with independent use of compensations    Time  5    Period  Weeks    Status  On-going      SLP LONG TERM GOAL #3   Title  Pt will utilize compensations for dysarthria to be 95% intellgilble in noisy environment over 15 minute conversation with rare min A    Time  5    Status  On-going       Plan - 12/04/17 7544    Clinical Impression Statement  Randall James presents today with 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 His self-monitoring skills for decr'd intelligibility are improving  however he does not always note his errors and attempt to repeat. Assistance continues necessary for use of dysarthria compensations in conversation and less so in structured tasks. I recommend cont'd skilled ST to maximize intelligibility and for training in swallow precautions to ensure safe diet advancement to improve nutrition/hydration and QOL.    Speech Therapy Frequency  2x / week    Treatment/Interventions  Aspiration precaution training;Environmental controls;Cueing hierarchy;Oral motor exercises;SLP instruction and feedback;Compensatory strategies;Functional tasks;Diet toleration management by SLP;Trials of upgraded texture/liquids;Internal/external aids;Patient/family education    Potential to Achieve Goals  Good    Potential Considerations  Severity of impairments       Patient will benefit from skilled therapeutic intervention in order to improve the following deficits and impairments:   Dysarthria and anarthria    Problem List Patient Active Problem List   Diagnosis Date Noted  . 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 Essential hypertension 10/24/2017  . Mixed hyperlipidemia 10/24/2017  . Dysphagia due to recent cerebrovascular accident (CVA) 10/24/2017   Deneise Lever, [place], [place] 12/04/2017, 8:43 AM  [facility] [address] [place], [place], [address] Phone: [phone]   Fax:  [phone]   Name: Randall James MRN: [id number] Date of Birth: [birth year]

## 2017-12-08 ENCOUNTER — Ambulatory Visit: Payer: Medicare PPO | Admitting: Speech Pathology

## 2017-12-08 DIAGNOSIS — R1312 Dysphagia, oropharyngeal phase: Secondary | ICD-10-CM

## 2017-12-08 DIAGNOSIS — R471 Dysarthria and anarthria: Secondary | ICD-10-CM

## 2017-12-08 NOTE — Therapy (Signed)
[facility] [address] [place], [place], [address] Phone: [phone]   Fax:  [phone]  Speech Language Pathology Treatment  Patient Details  Name: Randall James MRN: [id number] Date of Birth: 1950/05/26 Referring Provider: Sherlynn Carbon [facility]   Encounter Date: 12/08/2017  End of Session - 12/08/17 1016    Visit Number  13    Number of Visits  25    Date for SLP Re-Evaluation  12/23/17    SLP Start Time  0800    SLP Stop Time   0845    SLP Time Calculation (min)  45 min    Activity Tolerance  Patient tolerated treatment well       Past Medical History:  Diagnosis Date  . Hyperlipidemia   . Hypertension   . Stroke [facility]) 2019    No past surgical history on file.  There were no vitals filed for this visit.  Subjective Assessment - 12/08/17 0803    Subjective  "Tell him not to talk so much at the table." pt's wife reports pt getting distracted at mealtimes    Currently in Pain?  No/denies            ADULT SLP TREATMENT - 12/08/17 0802      General Information   Behavior/Cognition  Alert;Cooperative;Pleasant mood      Treatment Provided   Treatment provided  Dysphagia;Cognitive-Linquistic      Dysphagia Treatment   Temperature Spikes Noted  No    Respiratory Status  Room air    Oral Cavity - Dentition  Dentures, bottom;Dentures, top    Treatment Methods  Skilled observation;Upgraded PO texture trial;Compensation strategy training;Patient/caregiver education    Patient observed directly with PO's  Yes    Type of PO's observed  Thin liquids;Dysphagia 3 (soft)    Feeding  Able to feed self    Liquids provided via  Cup    Oral Phase Signs & Symptoms  Prolonged mastication;Prolonged bolus formation    Pharyngeal Phase Signs & Symptoms  -- none observed    Type of cueing  Verbal    Amount of cueing  Moderate    Other treatment/comments  (Swallowing tx, 20 min) Pt/wife report pt continues  to tolerate diet at home, but wife is concerned that pt continues to talk excessively during meals. SLP educated re: swallow delay and importance of initiating swallow, clearing oral cavity prior to speaking due to aspiration risk. Pt required usual min-mod A today to refrain from talking while chewing. SLP suggested using a visual reminder at home at mealtimes; provided handout with precautions for pt to use on his table at home. No overt signs of aspiration with thin liquids, mechanical soft solids this date.       Pain Assessment   Pain Assessment  No/denies pain      Cognitive-Linquistic Treatment   Treatment focused on  Dysarthria    Skilled Treatment  (Speech tx, 25 min) SLP worked with pt on intelligibility with compensations for dysarthria in multiparagraph reading (with cues to slow rate after ~5 minutes) and in simple-mod complex conversation with family. Cues for increased vocal intensity are effective, with improvements in articulation and rate of speech. Pt self-monitoring and attempting correction approximately 85% of the time.       Assessment / Recommendations / Plan   Plan  Continue with current plan of care      Dysphagia Recommendations   Diet recommendations  Dysphagia 3 (mechanical soft);Thin liquid  Liquids provided via  Cup;Straw    Medication Administration  Other (Comment) one at a time, as tolerated    Supervision  Patient able to self feed    Compensations  Slow rate;Small sips/bites;Follow solids with liquid reduce distractions    Postural Changes and/or Swallow Maneuvers  Seated upright 90 degrees      Progression Toward Goals   Progression toward goals  Progressing toward goals       SLP Education - 12/08/17 1015    Education provided  Yes    Education Details  reduce distractions at mealtimes; use visual reminder if necessary    Person(s) Educated  Patient;Spouse;Child(ren)    Methods  Explanation;Demonstration;Verbal cues;Handout    Comprehension   Verbalized understanding;Need further instruction       SLP Short Term Goals - 12/08/17 8182      SLP SHORT TERM GOAL #1   Title  Pt will follow swallow precautions with mod I over 2 sessions and per pt/family report    Status  Achieved      SLP SHORT TERM GOAL #2   Title  Pt will perform HEP for dysphagia and dysarthria with rare min A over 2 sessions    Status  Partially Met      SLP SHORT TERM GOAL #3   Title  Pt will utilize compensations for dysarthria with rare min A in simple conversation for 12 minutes over 2 sessions    Status  Not Met      SLP SHORT TERM GOAL #4   Title  Pt/family will verbalize s/s of aspiration pna with rare min A    Status  Partially Met      SLP SHORT TERM GOAL #5   Title  Pt will complete repeat objective swallow study    Status  Achieved       SLP Long Term Goals - 12/08/17 9937      SLP LONG TERM GOAL #1   Title  Pt will follow swallow precautions independently over 2 sessions    Baseline  11-18-17    Time  4    Period  Weeks    Status  On-going      SLP LONG TERM GOAL #2   Title  Pt will tolerate mechanical soft solids (Dysphagia 3) and thin liquids with independent use of compensations    Time  4    Period  Weeks    Status  On-going      SLP LONG TERM GOAL #3   Title  Pt will utilize compensations for dysarthria to be 95% intellgilble in noisy environment over 15 minute conversation with rare min A    Time  4    Period  Weeks    Status  On-going       Plan - 12/08/17 1016    Clinical Impression Statement  Mr. 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 His self-monitoring skills for decr'd intelligibility are improving however he does not always note his errors and attempt to repeat. Assistance continues necessary for use of dysarthria compensations in conversation and less so in structured tasks. I recommend cont'd skilled ST to maximize intelligibility and for training in swallow  precautions to ensure safe diet advancement to improve nutrition/hydration and QOL.    Speech Therapy Frequency  2x / week    Treatment/Interventions  Aspiration precaution training;Environmental controls;Cueing hierarchy;Oral motor exercises;SLP instruction and feedback;Compensatory strategies;Functional tasks;Diet toleration management by SLP;Trials of upgraded texture/liquids;Internal/external  aids;Patient/family education    Potential to Achieve Goals  Good    Potential Considerations  Severity of impairments    Consulted and Agree with Plan of Care  Patient;Family member/caregiver    Family Member Consulted  daughter       Patient will benefit from skilled therapeutic intervention in order to improve the following deficits and impairments:   Dysarthria and anarthria  Dysphagia, oropharyngeal phase    Problem List Patient Active Problem List   Diagnosis Date Noted  . 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 Essential hypertension 10/24/2017  . Mixed hyperlipidemia 10/24/2017  . Dysphagia due to recent cerebrovascular accident (CVA) 10/24/2017   Deneise Lever, [place], [place] 12/08/2017, 10:17 AM  [facility] [address] [place], [place], [address] Phone: [phone]   Fax:  [phone]   Name: Randall James MRN: [id number] Date of Birth: 1951/04/21

## 2017-12-08 NOTE — Patient Instructions (Signed)
    SLOW DOWN  NO TALKING WHEN EATING  SWALLOW BEFORE YOU TALK

## 2017-12-11 ENCOUNTER — Ambulatory Visit: Payer: Medicare PPO | Admitting: Speech Pathology

## 2017-12-11 DIAGNOSIS — R471 Dysarthria and anarthria: Secondary | ICD-10-CM

## 2017-12-11 DIAGNOSIS — R1312 Dysphagia, oropharyngeal phase: Secondary | ICD-10-CM

## 2017-12-11 NOTE — Therapy (Signed)
[facility] [address] [place], [place], [address] Phone: [phone]   Fax:  [phone]  Speech Language Pathology Treatment  Patient Details  Name: Randall James MRN: [id number] Date of Birth: 05/08/1951 Referring Provider: Sherlynn Carbon [facility]   Encounter Date: 12/11/2017  End of Session - 12/11/17 1217    Visit Number  14    Number of Visits  25    Date for SLP Re-Evaluation  12/23/17    SLP Start Time  0805    SLP Stop Time   0845    SLP Time Calculation (min)  40 min    Activity Tolerance  Patient tolerated treatment well       Past Medical History:  Diagnosis Date  . Hyperlipidemia   . Hypertension   . Stroke [facility]) 2019    No past surgical history on file.  There were no vitals filed for this visit.  Subjective Assessment - 12/11/17 0806    Subjective  "Nobody talks to me when I'm eating."    Patient is accompained by:  Family member wife    Currently in Pain?  No/denies            ADULT SLP TREATMENT - 12/11/17 0805      General Information   Behavior/Cognition  Alert;Cooperative;Pleasant mood      Treatment Provided   Treatment provided  Cognitive-Linquistic;Dysphagia      Dysphagia Treatment   Temperature Spikes Noted  No    Respiratory Status  Room air    Oral Cavity - Dentition  Dentures, bottom;Dentures, top    Treatment Methods  Skilled observation;Compensation strategy training;Patient/caregiver education    Patient observed directly with PO's  Yes    Type of PO's observed  Thin liquids;Dysphagia 3 (soft)    Feeding  Able to feed self    Liquids provided via  Cup    Oral Phase Signs & Symptoms  Prolonged mastication;Prolonged bolus formation    Pharyngeal Phase Signs & Symptoms  -- none    Type of cueing  Verbal    Amount of cueing  Independent    Other treatment/comments  (swallowing tx 12 min) Pt, wife report pt following swallow precautions at home; they have  been working to reduce distractions and pt has been using visual reminder at mealtimes. Pt stated swallow precautions and followed as SLP provided skilled observation of trials of dys 3, thin liquids. Pt consumed 4 oz soft solid and 6 oz thin liquids with no overt signs of aspiration.       Pain Assessment   Pain Assessment  No/denies pain      Cognitive-Linquistic Treatment   Treatment focused on  Dysarthria    Skilled Treatment  (speech treatment 28 min): Pt demo'd compensations for dysarthria in multiparagraph reading, mod I, 95% intelligibility due to weak pressure consonants/hypernasality. In 15 minutes conversation outside [facility] room, SLP requested repeat x1.      Assessment / Recommendations / Plan   Plan  Continue with current plan of care      Dysphagia Recommendations   Diet recommendations  Dysphagia 3 (mechanical soft);Thin liquid    Liquids provided via  Cup;Straw    Medication Administration  Other (Comment) one at a time, as tolerated    Supervision  Patient able to self feed    Compensations  Slow rate;Small sips/bites;Follow solids with liquid reduce environmental distractions    Postural Changes and/or Swallow Maneuvers  Seated upright 90 degrees  Progression Toward Goals   Progression toward goals  Progressing toward goals         SLP Short Term Goals - 12/11/17 1220      SLP SHORT TERM GOAL #1   Title  Pt will follow swallow precautions with mod I over 2 sessions and per pt/family report    Status  Achieved      SLP SHORT TERM GOAL #2   Title  Pt will perform HEP for dysphagia and dysarthria with rare min A over 2 sessions    Status  Partially Met      SLP SHORT TERM GOAL #3   Title  Pt will utilize compensations for dysarthria with rare min A in simple conversation for 12 minutes over 2 sessions    Status  Not Met      SLP SHORT TERM GOAL #4   Title  Pt/family will verbalize s/s of aspiration pna with rare min A    Status  Partially Met      SLP  SHORT TERM GOAL #5   Title  Pt will complete repeat objective swallow study    Status  Achieved       SLP Long Term Goals - 12/11/17 4917      SLP LONG TERM GOAL #1   Title  Pt will follow swallow precautions independently over 2 sessions    Baseline  11-18-17, 12/11/17    Time  4    Period  Weeks    Status  Achieved      SLP LONG TERM GOAL #2   Title  Pt will tolerate mechanical soft solids (Dysphagia 3) and thin liquids with independent use of compensations    Time  4    Period  Weeks    Status  Achieved      SLP LONG TERM GOAL #3   Title  Pt will utilize compensations for dysarthria to be 95% intellgilble in noisy environment over 15 minute conversation with rare min A x 2 sessions    Time  4    Period  Weeks    Status  Revised       Plan - 12/11/17 1220    Clinical Impression Statement  Mr. 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 His self-monitoring skills for decr'd intelligibility are improving; pt and wife report he is consistently self-correcting at home. Today pt demo'd compensations for dysarthria in 15 minutes conversation outside [facility] room for 95% intelligibility. Independent with swallowing precautions. Anticipate d/c in next 1-2 visits. I recommend cont'd skilled ST to maximize intelligibility and for training in swallow precautions to ensure safe diet advancement to improve nutrition/hydration and QOL.    Speech Therapy Frequency  2x / week    Treatment/Interventions  Aspiration precaution training;Environmental controls;Cueing hierarchy;Oral motor exercises;SLP instruction and feedback;Compensatory strategies;Functional tasks;Diet toleration management by SLP;Trials of upgraded texture/liquids;Internal/external aids;Patient/family education    Potential to Achieve Goals  Good    Potential Considerations  Severity of impairments    Consulted and Agree with Plan of Care  Patient;Family member/caregiver       Patient  will benefit from skilled therapeutic intervention in order to improve the following deficits and impairments:   Dysarthria and anarthria  Dysphagia, oropharyngeal phase    Problem List Patient Active Problem List   Diagnosis Date Noted  . 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 Essential hypertension 10/24/2017  . Mixed hyperlipidemia 10/24/2017  . Dysphagia due  to recent cerebrovascular accident (CVA) 10/24/2017   Deneise Lever, [place], [place] 12/11/2017, 12:21 PM  [facility] [address] [place], [place], [address] Phone: [phone]   Fax:  [phone]   Name: Gunnar Hereford MRN: [id number] Date of Birth: 1951/04/03

## 2017-12-15 ENCOUNTER — Ambulatory Visit: Payer: Medicare PPO | Admitting: Speech Pathology

## 2017-12-15 DIAGNOSIS — R471 Dysarthria and anarthria: Secondary | ICD-10-CM | POA: Diagnosis not present

## 2017-12-15 NOTE — Therapy (Signed)
[facility] [address] [place], [place], [address] Phone: [phone]   Fax:  [phone]  Speech Language Pathology Treatment and Discharge Summary  Patient Details  Name: Randall James MRN: [id number] Date of Birth: 1951-01-22 Referring Provider: Sherlynn Carbon [facility]   Encounter Date: 12/15/2017  End of Session - 12/15/17 1311    Visit Number  15    Number of Visits  25    Date for SLP Re-Evaluation  12/23/17    SLP Start Time  0804    SLP Stop Time   0845    SLP Time Calculation (min)  41 min    Activity Tolerance  Patient tolerated treatment well       Past Medical History:  Diagnosis Date  . Hyperlipidemia   . Hypertension   . Stroke [facility]) 2019    No past surgical history on file.  There were no vitals filed for this visit.  Subjective Assessment - 12/15/17 1307    Subjective  "I think I'm doing good."    Currently in Pain?  No/denies            ADULT SLP TREATMENT - 12/15/17 0804      General Information   Behavior/Cognition  Alert;Cooperative;Pleasant mood      Treatment Provided   Treatment provided  Cognitive-Linquistic      Pain Assessment   Pain Assessment  No/denies pain      Cognitive-Linquistic Treatment   Treatment focused on  Dysarthria    Skilled Treatment  Pt/family report he is getting less frequent requests to repeat himself and is self-correcting unintelligible speech at home. In 20 minutes conversation outside [facility] room, pt was >95% intelligible, with single repetition required.      Assessment / Recommendations / Plan   Plan  Discharge SLP treatment due to (comment) goals met      Progression Toward Goals   Progression toward goals  Goals met, education completed, patient discharged from [facility] - 12/15/17 0813      SLP SHORT TERM GOAL #1   Title  Pt will follow swallow precautions with mod I over 2 sessions and per pt/family  report    Status  Achieved      SLP SHORT TERM GOAL #2   Title  Pt will perform HEP for dysphagia and dysarthria with rare min A over 2 sessions    Status  Partially Met      SLP SHORT TERM GOAL #3   Title  Pt will utilize compensations for dysarthria with rare min A in simple conversation for 12 minutes over 2 sessions    Status  Not Met      SLP SHORT TERM GOAL #4   Title  Pt/family will verbalize s/s of aspiration pna with rare min A    Status  Partially Met      SLP SHORT TERM GOAL #5   Title  Pt will complete repeat objective swallow study    Status  Achieved       SLP Long Term Goals - 12/15/17 5597      SLP LONG TERM GOAL #1   Title  Pt will follow swallow precautions independently over 2 sessions    Status  Achieved      SLP LONG TERM GOAL #2   Title  Pt will tolerate mechanical soft solids (Dysphagia 3) and thin liquids with independent use of compensations  Status  Achieved      SLP LONG TERM GOAL #3   Title  Pt will utilize compensations for dysarthria to be 95% intellgilble in noisy environment over 15 minute conversation with rare min A x 2 sessions    Time  3    Period  Weeks    Status  Achieved       Plan - 12/15/17 1311    Clinical Impression Statement  Mr. Aloi continues with moderate dysarthria and mild dysphagia secondary to lt basal ganglia CVA. Intelligibility and swallowing are functional with use of compensatory strategies. Pt and wife report he is consistently self-correcting at home. Today pt demo'd compensations for dysarthria in 20 minutes conversation outside [facility] room for 95% intelligibility. Independent with swallowing precautions. Long term goals met and pt is in agreement with d/c at this time.     Speech Therapy Frequency  2x / week    Treatment/Interventions  Aspiration precaution training;Environmental controls;Cueing hierarchy;Oral motor exercises;SLP instruction and feedback;Compensatory strategies;Functional tasks;Diet toleration  management by SLP;Trials of upgraded texture/liquids;Internal/external aids;Patient/family education    Potential to Achieve Goals  Good    Potential Considerations  Severity of impairments    Consulted and Agree with Plan of Care  Patient;Family member/caregiver    Family Member Consulted  daughter       Patient will benefit from skilled therapeutic intervention in order to improve the following deficits and impairments:   Dysarthria and anarthria    Problem List Patient Active Problem List   Diagnosis Date Noted  . 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 Essential hypertension 10/24/2017  . Mixed hyperlipidemia 10/24/2017  . Dysphagia due to recent cerebrovascular accident (CVA) 10/24/2017   SPEECH THERAPY DISCHARGE SUMMARY  Visits from Start of Care: 15   Current functional level related to goals / functional outcomes: Pt demo'ing compensations for dysarthria in 20 minutes conversation for >95% intelligibility. Pt/wife report he is tolerating dys 3, thin liquids at home with swallowing precautions.    Remaining deficits: Pt continues to have moderate dysarthria; intelligibility may be affected for unfamiliar listeners due to weak pressure consonants, hypernasality and lingual/facial weakness.   Education / Equipment: Compensations for dysarthria and swallow precautions  Plan: Patient agrees to discharge.  Patient goals were met. Patient is being discharged due to meeting the stated rehab goals.  ?????          Randall James, [place], CCC-SLP Speech-Language Pathologist  Aliene Altes 12/15/2017, 1:15 PM  [facility] [address] [place], [place], [address] Phone: [phone]   Fax:  [phone]   Name: Randall James MRN: [id number] Date of Birth: 08/09/1950

## 2017-12-18 ENCOUNTER — Encounter: Payer: Medicare PPO | Admitting: Speech Pathology

## 2017-12-30 ENCOUNTER — Encounter

## 2017-12-30 ENCOUNTER — Ambulatory Visit: Payer: Medicare PPO | Admitting: Neurology

## 2017-12-30 ENCOUNTER — Encounter: Payer: Self-pay | Admitting: Neurology

## 2017-12-30 VITALS — BP 117/73 | HR 67 | Ht 68.0 in | Wt 156.4 lb

## 2017-12-30 DIAGNOSIS — I6381 Other cerebral infarction due to occlusion or stenosis of small artery: Secondary | ICD-10-CM

## 2017-12-30 MED ORDER — COENZYME Q10 100 MG PO CAPS: 200.0000 mg | ORAL_CAPSULE | Freq: Every day | ORAL | 1 refills | Status: AC

## 2017-12-30 NOTE — Patient Instructions (Signed)
I had a long d/w patient and his daughter about his recent lacunar stroke, risk for recurrent stroke/TIAs, personally independently reviewed imaging studies and stroke evaluation results and answered questions.Continue aspirin 325 mg daily  but discontinue Plavix as it has been more than 3 months since her stroke for secondary stroke prevention and maintain strict control of hypertension with blood pressure goal below 130/90, diabetes with hemoglobin A1c goal below 6.5% and lipids with LDL cholesterol goal below 70 mg/dL. I also advised the patient to eat a healthy diet with plenty of whole grains, cereals, fruits and vegetables, exercise regularly and maintain ideal body weight. I have complimented him on quitting tobacco The patient may drive and start playing golf again. Followup in the future with my nurse practitioner Shanda BumpsJessica in 3 months or call earlier if necessary   Stroke Prevention Some medical conditions and behaviors are associated with a higher chance of having a stroke. You can help prevent a stroke by making nutrition, lifestyle, and other changes, including managing any medical conditions you may have. What nutrition changes can be made?  Eat healthy foods. You can do this by: ? Choosing foods high in fiber, such as fresh fruits and vegetables and whole grains. ? Eating at least 5 or more servings of fruits and vegetables a day. Try to fill half of your plate at each meal with fruits and vegetables. ? Choosing lean protein foods, such as lean cuts of meat, poultry without skin, fish, tofu, beans, and nuts. ? Eating low-fat dairy products. ? Avoiding foods that are high in salt (sodium). This can help lower blood pressure. ? Avoiding foods that have saturated fat, trans fat, and cholesterol. This can help prevent high cholesterol. ? Avoiding processed and premade foods.  Follow your health care provider's specific guidelines for losing weight, controlling high blood pressure  (hypertension), lowering high cholesterol, and managing diabetes. These may include: ? Reducing your daily calorie intake. ? Limiting your daily sodium intake to 1,500 milligrams (mg). ? Using only healthy fats for cooking, such as olive oil, canola oil, or sunflower oil. ? Counting your daily carbohydrate intake. What lifestyle changes can be made?  Maintain a healthy weight. Talk to your health care provider about your ideal weight.  Get at least 30 minutes of moderate physical activity at least 5 days a week. Moderate activity includes brisk walking, biking, and swimming.  Do not use any products that contain nicotine or tobacco, such as cigarettes and e-cigarettes. If you need help quitting, ask your health care provider. It may also be helpful to avoid exposure to secondhand smoke.  Limit alcohol intake to no more than 1 drink a day for nonpregnant women and 2 drinks a day for men. One drink equals 12 oz of beer, 5 oz of wine, or 1 oz of hard liquor.  Stop any illegal drug use.  Avoid taking birth control pills. Talk to your health care provider about the risks of taking birth control pills if: ? You are over 726 years old. ? You smoke. ? You get migraines. ? You have ever had a blood clot. What other changes can be made?  Manage your cholesterol levels. ? Eating a healthy diet is important for preventing high cholesterol. If cholesterol cannot be managed through diet alone, you may also need to take medicines. ? Take any prescribed medicines to control your cholesterol as told by your health care provider.  Manage your diabetes. ? Eating a healthy diet and exercising regularly are  important parts of managing your blood sugar. If your blood sugar cannot be managed through diet and exercise, you may need to take medicines. ? Take any prescribed medicines to control your diabetes as told by your health care provider.  Control your hypertension. ? To reduce your risk of stroke, try  to keep your blood pressure below 130/80. ? Eating a healthy diet and exercising regularly are an important part of controlling your blood pressure. If your blood pressure cannot be managed through diet and exercise, you may need to take medicines. ? Take any prescribed medicines to control hypertension as told by your health care provider. ? Ask your health care provider if you should monitor your blood pressure at home. ? Have your blood pressure checked every year, even if your blood pressure is normal. Blood pressure increases with age and some medical conditions.  Get evaluated for sleep disorders (sleep apnea). Talk to your health care provider about getting a sleep evaluation if you snore a lot or have excessive sleepiness.  Take over-the-counter and prescription medicines only as told by your health care provider. Aspirin or blood thinners (antiplatelets or anticoagulants) may be recommended to reduce your risk of forming blood clots that can lead to stroke.  Make sure that any other medical conditions you have, such as atrial fibrillation or atherosclerosis, are managed. What are the warning signs of a stroke? The warning signs of a stroke can be easily remembered as BEFAST.  B is for balance. Signs include: ? Dizziness. ? Loss of balance or coordination. ? Sudden trouble walking.  E is for eyes. Signs include: ? A sudden change in vision. ? Trouble seeing.  F is for face. Signs include: ? Sudden weakness or numbness of the face. ? The face or eyelid drooping to one side.  A is for arms. Signs include: ? Sudden weakness or numbness of the arm, usually on one side of the body.  S is for speech. Signs include: ? Trouble speaking (aphasia). ? Trouble understanding.  T is for time. ? These symptoms may represent a serious problem that is an emergency. Do not wait to see if the symptoms will go away. Get medical help right away. Call your local emergency services (911 in the  [place] Do not drive yourself to the hospital.  Other signs of stroke may include: ? A sudden, severe headache with no known cause. ? Nausea or vomiting. ? Seizure.  Where to find more information: For more information, visit:  American Stroke Association: www.strokeassociation.org  National Stroke Association: www.stroke.org  Summary  You can prevent a stroke by eating healthy, exercising, not smoking, limiting alcohol intake, and managing any medical conditions you may have.  Do not use any products that contain nicotine or tobacco, such as cigarettes and e-cigarettes. If you need help quitting, ask your health care provider. It may also be helpful to avoid exposure to secondhand smoke.  Remember BEFAST for warning signs of stroke. Get help right away if you or a loved one has any of these signs. This information is not intended to replace advice given to you by your health care provider. Make sure you discuss any questions you have with your health care provider. Document Released: 06/13/2004 Document Revised: 06/11/2016 Document Reviewed: 06/11/2016 Elsevier Interactive Patient Education  [employer]2018 [employer]

## 2017-12-30 NOTE — Progress Notes (Signed)
[facility] [address] [place]. [place] [address] [phone]       OFFICE CONSULT NOTE  Randall. Randall James Date of Birth:  [birth year] Medical Record Number:  [id number]   Referring MD: Randall James  Reason for Referral:  stroke  HPI: Randall James is a 47 year African-American male who is accompanied today by his daughter. He is referred for evaluation for stroke. History is obtained from the patient, daughter and review of available medical records from [facility]. hospital in [place]. I have personally reviewed imaging films which are visible on the disc which the patient brought. The patient's the left sudden onset of speech difficulties and trouble swallowing on 09/22/17. He initially saw his primary physician who treated him for UTI but when his symptoms did not improve his wife drove him into the hospital later that day. He describes being seen by tele-stroke specialists who informed him that he was outside the tPA window. CT scan of the head was obtained which I personally reviewed showed old right basal ganglia infarct without acute abnormality. MRI scan of the brain showed a patchy left corona radiata infarct. CT angiogram of the neck showed an ulcerated plaque in the proximal left carotid without significant stenosis. Echocardiogram.was apparently nremarkable. Lipid profile labs are not available but patient was started on Lipitor 40 mg daily as well as Norvasc for hypertension. He was started on dual antiplatelet therapy of aspirin and Plavix which is tolerating well without major bleeding but does complain of increased bruising. Patient got some inpatient therapy and subsequently moved to [facility] to be closer to his daughter. He is recently finished outpatient speech therapy and has opted improvement in his swallowing as well as speech. He complains of mild weakness in his right hand as well as pain. He has quit chewing tobacco. He wants to drive  and play golf. He denies any known prior history of stroke but his CT scan had clearly shown an old right basal ganglia infarct.  ROS:   14 system review of systems is positive for  dysphagia, dysarthria, feeling cold, easy bleeding, joint pain and all other systems negative  PMH:  Past Medical History:  Diagnosis Date  . Hyperlipidemia   . Hypertension   . Stroke [facility]) 2019    Social History:  Social History   Socioeconomic History  . Marital status: Married    Spouse name: Not on file  . Number of children: Not on file  . Years of education: Not on file  . Highest education level: Not on file  Occupational History  . Not on file  Social Needs  . Financial resource strain: Not on file  . Food insecurity:    Worry: Not on file    Inability: Not on file  . Transportation needs:    Medical: Not on file    Non-medical: Not on file  Tobacco Use  . Smoking status: Former Games developer  . Smokeless tobacco: Former Neurosurgeon    Types: Chew  Substance and Sexual Activity  . Alcohol use: Not Currently  . Drug use: Not Currently  . Sexual activity: Not Currently  Lifestyle  . Physical activity:    Days per week: Not on file    Minutes per session: Not on file  . Stress: Not on file  Relationships  . Social connections:    Talks on phone: Not on file    Gets together: Not on file    Attends religious service: Not  on file    Active member of club or organization: Not on file    Attends meetings of clubs or organizations: Not on file    Relationship status: Not on file  . Intimate partner violence:    Fear of current or ex partner: Not on file    Emotionally abused: Not on file    Physically abused: Not on file    Forced sexual activity: Not on file  Other Topics Concern  . Not on file  Social History Narrative   Social History      Diet? none      Do you drink/eat things with caffeine? Coffee- yes      Marital status?              married                      What year were  you married? 08/08/1973      Do you live in a house, apartment, assisted living, condo, trailer, etc.? house      Is it one or more stories? 2 stories      How many persons live in your home? 2      Do you have any pets in your home? (please list) none      Highest level of education completed? BS in Education and Social Studies      Current or past profession: school teacher      Do you exercise?         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 no      Do you have a DNR form?                                  If not, do you want to discuss one? no      Do you have signed POA/HPOA for forms? no      Functional Status      Do you have difficulty bathing or dressing yourself? yes      Do you have difficulty preparing food or eating? yes      Do you have difficulty managing your medications? yes      Do you have difficulty managing your finances? no      Do you have difficulty affording your medications? No     Medications:   Current Outpatient Medications on File Prior to Visit  Medication Sig Dispense Refill  . amLODipine (NORVASC) 10 MG tablet Take 1 tablet (10 mg total) by mouth daily. 90 tablet 0  . aspirin 325 MG EC tablet Take 325 mg by mouth daily.    Marland Kitchen. atorvastatin (LIPITOR) 40 MG tablet Take 1 tablet (40 mg total) by mouth daily. 90 tablet 0   No current facility-administered medications on file prior to visit.     Allergies:  No Known Allergies  Physical Exam General: well developed, well nourished middle-aged African-American male, seated, in no evident distress Head: head normocephalic and atraumatic.   Neck: supple with no carotid or supraclavicular bruits Cardiovascular: regular rate and rhythm, no murmurs Musculoskeletal: no  deformity Skin:  no rash/petichiae Vascular:  Normal pulses all  extremities  Neurologic Exam Mental Status: Awake and fully alert. Oriented to place and time. Recent and remote memory intact. Attention span, concentration and fund of knowledge appropriate. Mood and affect appropriate. Mild dysarthria and no aphasia. Cranial Nerves: Fundoscopic exam reveals sharp disc margins. Pupils equal, briskly reactive to light. Extraocular movements full without nystagmus. Visual fields full to confrontation. Hearing intact. Facial sensation intact. Mild right lower facial weakness., tongue, palate moves normally and symmetrically.  Motor: Normal bulk and tone. Normal strength in all tested extremity muscles. Mild weakness of right grip. Diminished fine finger movements on the right. Orbits left over right upper extremity. Sensory.: intact to touch , pinprick , position and vibratory sensation.  Coordination: Rapid alternating movements normal in all extremities. Finger-to-nose and heel-to-shin performed accurately bilaterally. Gait and Station: Arises from chair without difficulty. Stance is normal. Gait demonstrates normal stride length and balance . Able to heel, toe and tandem walk without difficulty.  Reflexes: 1+ and symmetric. Toes downgoing.   NIHSS  2 Modified Rankin  1   ASSESSMENT: [age] year male with left basal ganglia infarct in May 2019 secondary to likely small vessel disease. Vascular risk factors of hypertension, hyperlipidemia, silent cerebrovascular disease, chewing tobacco and left carotid stenosis.    PLAN: I had a long d/w patient and his daughter about his recent lacunar stroke, risk for recurrent stroke/TIAs, personally independently reviewed imaging studies and stroke evaluation results and answered questions.Continue aspirin 325 mg daily  but discontinue Plavix as it has been more than 3 months since her stroke for secondary stroke prevention and maintain strict control of hypertension with blood pressure goal below 130/90, diabetes with hemoglobin  A1c goal below 6.5% and lipids with LDL cholesterol goal below 70 mg/dL. I also advised the patient to eat a healthy diet with plenty of whole grains, cereals, fruits and vegetables, exercise regularly and maintain ideal body weight. I have complimented him on quitting tobacco The patient may drive and start playing golf again. Greater than 50% of time during this 45 minute consultation visit was spent on counseling and coordination of care about his lacunar stroke discussion about stroke prevention and treatment and answering questions Followup in the future with my nurse practitioner Shanda BumpsJessica in 3 months or call earlier if necessary Delia HeadyPramod Lavella Myren, MD   Note: This document was prepared with digital dictation and possible smart phrase technology. Any transcriptional errors that result from this process are unintentional.

## 2018-01-07 ENCOUNTER — Encounter: Payer: Self-pay | Admitting: Internal Medicine

## 2018-01-15 ENCOUNTER — Other Ambulatory Visit: Payer: Self-pay | Admitting: *Deleted

## 2018-01-15 DIAGNOSIS — I10 Essential (primary) hypertension: Secondary | ICD-10-CM

## 2018-01-15 DIAGNOSIS — E782 Mixed hyperlipidemia: Secondary | ICD-10-CM

## 2018-01-15 MED ORDER — AMLODIPINE BESYLATE 10 MG PO TABS
10.0000 mg | ORAL_TABLET | Freq: Every day | ORAL | 0 refills | Status: DC
Start: 2018-01-15 — End: 2018-04-14

## 2018-01-15 MED ORDER — ATORVASTATIN CALCIUM 40 MG PO TABS: 40.0000 mg | ORAL_TABLET | Freq: Every day | ORAL | 0 refills | Status: DC

## 2018-01-15 NOTE — Telephone Encounter (Signed)
Patient daughter, April requested refill. Patient has upcoming appointment.

## 2018-01-26 ENCOUNTER — Other Ambulatory Visit: Payer: Medicare PPO

## 2018-01-26 DIAGNOSIS — E782 Mixed hyperlipidemia: Secondary | ICD-10-CM

## 2018-01-26 DIAGNOSIS — Z79899 Other long term (current) drug therapy: Secondary | ICD-10-CM

## 2018-01-28 ENCOUNTER — Ambulatory Visit (INDEPENDENT_AMBULATORY_CARE_PROVIDER_SITE_OTHER): Payer: Medicare PPO | Admitting: Internal Medicine

## 2018-01-28 ENCOUNTER — Encounter: Payer: Self-pay | Admitting: Internal Medicine

## 2018-01-28 VITALS — BP 116/76 | HR 70 | Temp 98.0°F | Ht 68.0 in | Wt 152.0 lb

## 2018-01-28 DIAGNOSIS — Z23 Encounter for immunization: Secondary | ICD-10-CM

## 2018-01-28 DIAGNOSIS — I69322 Dysarthria following cerebral infarction: Secondary | ICD-10-CM | POA: Diagnosis not present

## 2018-01-28 DIAGNOSIS — I10 Essential (primary) hypertension: Secondary | ICD-10-CM

## 2018-01-28 DIAGNOSIS — E782 Mixed hyperlipidemia: Secondary | ICD-10-CM

## 2018-01-28 DIAGNOSIS — G5691 Unspecified mononeuropathy of right upper limb: Secondary | ICD-10-CM | POA: Diagnosis not present

## 2018-01-28 DIAGNOSIS — R7303 Prediabetes: Secondary | ICD-10-CM

## 2018-01-28 DIAGNOSIS — D649 Anemia, unspecified: Secondary | ICD-10-CM

## 2018-01-28 MED ORDER — GABAPENTIN 100 MG PO CAPS: 100.0000 mg | ORAL_CAPSULE | Freq: Every day | ORAL | 3 refills | Status: DC

## 2018-01-28 NOTE — Progress Notes (Signed)
Patient ID: Randall James, male   DOB: 1951/01/18, 67 y.o.   MRN: [id number]   Location:  [facility] OFFICE  Provider: DR Elmon Kirschner  Code Status:  Goals of Care:  Advanced Directives 01/28/2018  Does Patient Have a Medical Advance Directive? No  Would patient like information on creating a medical advance directive? -     Chief Complaint  Patient presents with  . Medical Management of Chronic Issues    3 month follow-up for stroke, HTN, and hyperlipidemia   . Screening    Audit-C screening, Neg     HPI: Patient is a 67 y.o. male seen today for medical management of chronic diseases.  He c/o post nasal drip, rhinorrhea, sneezing. No sinus pressure now but did initially No cough, f/c, HA, dizziness, ear pain/pressure.  HTN - BP stable on amlodipine.   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 He completed 3 mos plavix and completed ST. He has dysarthria. Followed by neurology Dr Pearlean Brownie. He was released to drive  Hyperlipidemia - no myalgias. Takes lipitor. LDL 66 (prev 168); HDL 52  Tobacco abuse - quit smoking several yrs ago. He no longer chews tobacco  CKD -stage 3. Cr 1.35 (prev 1.2)  Prediabetes - A1c 5.9%  Anemia - stable; Hgb 12.6 with MCV 79.6  Past Medical History:  Diagnosis Date  . Hyperlipidemia   . Hypertension   . Stroke [facility]) 2019    History reviewed. No pertinent surgical history.   reports that he has quit smoking. He has quit using smokeless tobacco.  His smokeless tobacco use included chew. He reports that he drank alcohol. He reports that he has current or past drug history. Social History   Socioeconomic History  . Marital status: Married    Spouse name: Not on file  . Number of children: Not on file  . Years of education: Not on file  . Highest education level: Not on file  Occupational History  . Not on file  Social Needs  . Financial resource strain: Not on file  . Food insecurity:    Worry: Not on file   Inability: Not on file  . Transportation needs:    Medical: Not on file    Non-medical: Not on file  Tobacco Use  . Smoking status: Former Games developer  . Smokeless tobacco: Former Neurosurgeon    Types: Chew  Substance and Sexual Activity  . Alcohol use: Not Currently  . Drug use: Not Currently  . Sexual activity: Not Currently  Lifestyle  . Physical activity:    Days per week: Not on file    Minutes per session: Not on file  . Stress: Not on file  Relationships  . Social connections:    Talks on phone: Not on file    Gets together: Not on file    Attends religious service: Not on file    Active member of club or organization: Not on file    Attends meetings of clubs or organizations: Not on file    Relationship status: Not on file  . Intimate partner violence:    Fear of current or ex partner: Not on file    Emotionally abused: Not on file    Physically abused: Not on file    Forced sexual activity: Not on file  Other Topics Concern  . Not on file  Social History Narrative   Social History      Diet? none  Do you drink/eat things with caffeine? Coffee- yes      Marital status?              married                      What year were you married? 08/08/1973      Do you live in a house, apartment, assisted living, condo, trailer, etc.? house      Is it one or more stories? 2 stories      How many persons live in your home? 2      Do you have any pets in your home? (please list) none      Highest level of education completed? BS in Education and Social Studies      Current or past profession: school teacher      Do you exercise?         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 no      Do you have a DNR form?                                  If not, do you want to discuss one? no      Do you have signed POA/HPOA for forms? no       Functional Status      Do you have difficulty bathing or dressing yourself? yes      Do you have difficulty preparing food or eating? yes      Do you have difficulty managing your medications? yes      Do you have difficulty managing your finances? no      Do you have difficulty affording your medications? No     Family History  Problem Relation Age of Onset  . Cancer Neg Hx   . Hypertension Neg Hx   . Heart disease Neg Hx     No Known Allergies  Outpatient Encounter Medications as of 01/28/2018  Medication Sig  . amLODipine (NORVASC) 10 MG tablet Take 1 tablet (10 mg total) by mouth daily.  Marland Kitchen aspirin 325 MG EC tablet Take 325 mg by mouth daily.  Marland Kitchen atorvastatin (LIPITOR) 40 MG tablet Take 1 tablet (40 mg total) by mouth daily.  . Coenzyme Q10 100 MG capsule Take 2 capsules (200 mg total) by mouth daily.   No facility-administered encounter medications on file as of 01/28/2018.     Review of Systems:  Review of Systems  HENT: Positive for drooling (but better), postnasal drip, rhinorrhea, sneezing and trouble swallowing (occasionally). Negative for sinus pressure.   Musculoskeletal: Positive for joint swelling (in fingers).  All other systems reviewed and are negative.   Health Maintenance  Topic Date Due  . Hepatitis C Screening  03-15-1951  . TETANUS/TDAP  03/31/1968  . COLONOSCOPY  04/01/1999  . PNA vac Low Risk Adult (1 of 2 - PCV13) 03/31/2014  . INFLUENZA VACCINE  12/18/2017    Physical Exam: Vitals:   01/28/18 1416 01/28/18 1417  BP:  116/76  Pulse:  70  Temp:  98 F (36.7 C)  TempSrc:  Oral  SpO2:  98%  Weight:  152 lb (68.9 kg)  Height: 5\' 8"  (1.727 m) 5\' 8"  (1.727 m)   Body mass index is 23.11 kg/m. Physical Exam  Constitutional: He is oriented to person, place, and time. He appears well-developed and well-nourished.  HENT:  Mouth/Throat: Oropharynx is clear and moist.  TMs dull L>R but intact, nonbulging no redness; no sinus TTP;  oropharynx cobblestoning and red but no exudate; MMM; no oral thrush  Eyes: Pupils are equal, round, and reactive to light. No scleral icterus.  Neck: Neck supple. Carotid bruit is not present. No thyromegaly present.  Cardiovascular: Normal rate, regular rhythm and intact distal pulses. Exam reveals no gallop and no friction rub.  Murmur (1/6 SEM) heard. no distal LE swelling. No calf TTP  Pulmonary/Chest: Effort normal and breath sounds normal. He has no wheezes. He has no rales. He exhibits no tenderness.  Abdominal: Soft. Bowel sounds are normal. He exhibits no distension, no abdominal bruit, no pulsatile midline mass and no mass. There is no hepatomegaly. There is no tenderness. There is no rebound and no guarding.  Musculoskeletal: He exhibits edema (small joints).  Lymphadenopathy:    He has no cervical adenopathy.  Neurological: He is alert and oriented to person, place, and time. He has normal reflexes. A cranial nerve deficit (left facial droop) is present. Gait (mildly unsteady) abnormal.  Min reduced right grip strength; dysarthria; tongue deviates to right when protruded  Skin: Skin is warm and dry. No rash noted.  Psychiatric: He has a normal mood and affect. His behavior is normal. Judgment and thought content normal.    Labs reviewed: Basic Metabolic Panel: Recent Labs    09/26/17 01/26/18 0820  NA 142 142  K 4.0 4.3  CL  --  105  CO2  --  30  GLUCOSE  --  82  BUN 12 8  CREATININE 1.2 1.35*  CALCIUM  --  9.3   Liver Function Tests: Recent Labs    09/26/17 01/26/18 0820  AST 25 15  ALT 18 10  ALKPHOS 76  --   BILITOT  --  0.5  PROT  --  6.5   No results for input(s): LIPASE, AMYLASE in the last 8760 hours. No results for input(s): AMMONIA in the last 8760 hours. CBC: Recent Labs    09/26/17 01/26/18 0820  WBC 5.2 4.9  NEUTROABS  --  1,989  HGB 11.8* 12.6*  HCT 36* 39.0  MCV  --  79.6*  PLT 201 189   Lipid Panel: Recent Labs    09/26/17  01/26/18 0820  CHOL 240* 136  HDL 49 52  LDLCALC 168 66  TRIG 112 93  CHOLHDL  --  2.6   Lab Results  Component Value Date   HGBA1C 5.9 09/26/2017    Procedures since last visit: No results found.  Assessment/Plan   ICD-10-CM   1. 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 Essential hypertension I10   3. Mixed hyperlipidemia E78.2   4. 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 Anemia, unspecified type D64.9   6. 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 Fasting labs prior to appt (a1c, bmp, alt, lipid panel)   Miriam Kestler S. Ancil Linsey  [facility] [address] [place], [place] [address] [phone] Cell (Monday-Friday 8 AM - 5 PM) [phone] After 5 PM and follow prompts

## 2018-01-28 NOTE — Patient Instructions (Addendum)
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 Fasting labs prior to appt   Fat and Cholesterol Restricted Diet Getting too much fat and cholesterol in your diet may cause health problems. Following this diet helps keep your fat and cholesterol at normal levels. This can keep you from getting sick. What types of fat should I choose?  Choose monosaturated and polyunsaturated fats. These are found in foods such as olive oil, canola oil, flaxseeds, walnuts, almonds, and seeds.  Eat more omega-3 fats. Good choices include salmon, mackerel, sardines, tuna, flaxseed oil, and ground flaxseeds.  Limit saturated fats. These are in animal products such as meats, butter, and cream. They can also be in plant products such as palm oil, palm kernel oil, and coconut oil.  Avoid foods with partially hydrogenated oils in them. These contain trans fats. Examples of foods that have trans fats are stick margarine, some tub margarines, cookies, crackers, and other baked goods. What general guidelines do I need to follow?  Check food labels. Look for the words "trans fat" and "saturated fat."  When preparing a meal: ? Fill half of your plate with vegetables and green salads. ? Fill one fourth of your plate with whole grains. Look for the word "whole" as the first word in the ingredient list. ? Fill one fourth of your plate with lean protein foods.  Eat more foods that have  fiber, like apples, carrots, beans, peas, and barley.  Eat more home-cooked foods. Eat less at restaurants and buffets.  Limit or avoid alcohol.  Limit foods high in starch and sugar.  Limit fried foods.  Cook foods without frying them. Baking, boiling, grilling, and broiling are all great options.  Lose weight if you are overweight. Losing even a small amount of weight can help your overall health. It can also help prevent diseases such as diabetes and heart disease. What foods can I eat? Grains Whole grains, such as whole wheat or whole grain breads, crackers, cereals, and pasta. Unsweetened oatmeal, bulgur, barley, quinoa, or brown rice. Corn or whole wheat flour tortillas. Vegetables Fresh or frozen vegetables (raw, steamed, roasted, or grilled). Green salads. Fruits All fresh, canned (in natural juice), or frozen fruits. Meat and Other Protein Products Ground beef (85% or leaner), grass-fed beef, or beef trimmed of fat. Skinless chicken or [place]. Ground chicken or [place]. Pork trimmed of fat. All fish and seafood. Eggs. Dried beans, peas, or lentils. Unsalted nuts or seeds. Unsalted canned or dry beans. Dairy Low-fat dairy products, such as skim or 1% milk, 2% or reduced-fat cheeses, low-fat ricotta or cottage cheese, or plain low-fat yogurt. Fats and Oils Tub margarines without trans fats. Light or reduced-fat mayonnaise and salad dressings. Avocado. Olive, canola, sesame, or safflower oils. Natural peanut or almond butter (choose ones without added sugar and oil). The items listed above may not be a complete list of recommended foods or beverages. Contact your dietitian for more options.  What foods are not recommended? Grains White bread. White pasta. White rice. Cornbread. Bagels, pastries, and croissants. Crackers that contain trans fat. Vegetables White potatoes. Corn. Creamed or fried vegetables. Vegetables in a cheese sauce. Fruits Dried fruits. Canned fruit in light or  heavy syrup. Fruit juice. Meat and Other Protein Products Fatty cuts of meat. Ribs, chicken wings, bacon, sausage, bologna, salami, chitterlings, fatback, hot dogs, bratwurst, and packaged luncheon meats. Liver and organ meats. Dairy Whole or 2% milk, cream, half-and-half, and cream cheese. Whole milk cheeses. Whole-fat or sweetened yogurt. Full-fat cheeses. Nondairy creamers and whipped toppings. Processed cheese, cheese spreads, or cheese curds. Sweets and Desserts Corn syrup, sugars, honey, and molasses. Candy. Jam and jelly. Syrup. Sweetened cereals. Cookies, pies, cakes, donuts, muffins, and ice cream. Fats and Oils Butter, stick margarine, lard, shortening, ghee, or bacon fat. Coconut, palm kernel, or palm oils. Beverages Alcohol. Sweetened drinks (such as sodas, lemonade, and fruit drinks or punches). The items listed above may not be a complete list of foods and beverages to avoid. Contact your dietitian for more information. This information is not intended to replace advice given to you by your health care provider. Make sure you discuss any questions you have with your health care provider. Document Released: 11/05/2011 Document Revised: 01/11/2016 Document Reviewed: 08/05/2013 Elsevier Interactive Patient Education  [employer].

## 2018-01-30 LAB — COMPLETE METABOLIC PANEL WITH GFR
AG Ratio: 1.5 (calc) (ref 1.0–2.5)
ALBUMIN MSPROF: 3.9 g/dL (ref 3.6–5.1)
ALT: 10 U/L (ref 9–46)
AST: 15 U/L (ref 10–35)
Alkaline phosphatase (APISO): 79 U/L (ref 40–115)
BUN/Creatinine Ratio: 6 (calc) (ref 6–22)
BUN: 8 mg/dL (ref 7–25)
CALCIUM: 9.3 mg/dL (ref 8.6–10.3)
CO2: 30 mmol/L (ref 20–32)
CREATININE: 1.35 mg/dL — AB (ref 0.70–1.25)
Chloride: 105 mmol/L (ref 98–110)
GFR, EST AFRICAN AMERICAN: 62 mL/min/{1.73_m2} (ref >=60)
GFR, EST NON AFRICAN AMERICAN: 54 mL/min/{1.73_m2} — AB (ref >=60)
GLOBULIN: 2.6 g/dL (ref 1.9–3.7)
GLUCOSE: 82 mg/dL (ref 65–99)
Potassium: 4.3 mmol/L (ref 3.5–5.3)
Sodium: 142 mmol/L (ref 135–146)
TOTAL PROTEIN: 6.5 g/dL (ref 6.1–8.1)
Total Bilirubin: 0.5 mg/dL (ref 0.2–1.2)

## 2018-01-30 LAB — CBC WITH DIFFERENTIAL/PLATELET
Basophils Absolute: 29 cells/uL (ref 0–200)
Basophils Relative: 0.6 %
EOS ABS: 181 {cells}/uL (ref 15–500)
Eosinophils Relative: 3.7 %
HCT: 39 % (ref 38.5–50.0)
Hemoglobin: 12.6 g/dL — ABNORMAL LOW (ref 13.2–17.1)
Lymphs Abs: 2185 cells/uL (ref 850–3900)
MCH: 25.7 pg — ABNORMAL LOW (ref 27.0–33.0)
MCHC: 32.3 g/dL (ref 32.0–36.0)
MCV: 79.6 fL — ABNORMAL LOW (ref 80.0–100.0)
MONOS PCT: 10.5 %
MPV: 11.2 fL (ref 7.5–12.5)
NEUTROS PCT: 40.6 %
Neutro Abs: 1989 cells/uL (ref 1500–7800)
PLATELETS: 189 10*3/uL (ref 140–400)
RBC: 4.9 10*6/uL (ref 4.20–5.80)
RDW: 14.2 % (ref 11.0–15.0)
TOTAL LYMPHOCYTE: 44.6 %
WBC: 4.9 10*3/uL (ref 3.8–10.8)
WBCMIX: 515 {cells}/uL (ref 200–950)

## 2018-01-30 LAB — LIPID PANEL
CHOLESTEROL: 136 mg/dL (ref <200)
HDL: 52 mg/dL (ref >40)
LDL Cholesterol (Calc): 66 mg/dL (calc)
Non-HDL Cholesterol (Calc): 84 mg/dL (calc) (ref <130)
Total CHOL/HDL Ratio: 2.6 (calc) (ref <5.0)
Triglycerides: 93 mg/dL (ref <150)

## 2018-01-30 LAB — TEST AUTHORIZATION

## 2018-01-30 LAB — FERRITIN: Ferritin: 126 ng/mL (ref 24–380)

## 2018-01-30 LAB — IRON: IRON: 80 ug/dL (ref 50–180)

## 2018-02-05 ENCOUNTER — Other Ambulatory Visit: Payer: Medicare PPO

## 2018-03-02 ENCOUNTER — Telehealth: Payer: Self-pay | Admitting: *Deleted

## 2018-03-02 NOTE — Telephone Encounter (Signed)
April, daughter called and stated that Dr. Montez Morita released patient to go back to work. 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 Daughter will pick up when ready. Please Advise.

## 2018-03-04 ENCOUNTER — Encounter: Payer: Self-pay | Admitting: Internal Medicine

## 2018-03-04 NOTE — Telephone Encounter (Signed)
Done

## 2018-03-04 NOTE — Telephone Encounter (Signed)
Letter printed and signed.  Daughter notified it is ready for pick up Left up front in drawer

## 2018-03-31 ENCOUNTER — Other Ambulatory Visit: Payer: Self-pay | Admitting: *Deleted

## 2018-03-31 DIAGNOSIS — G5691 Unspecified mononeuropathy of right upper limb: Secondary | ICD-10-CM

## 2018-03-31 MED ORDER — GABAPENTIN 100 MG PO CAPS: 100.0000 mg | ORAL_CAPSULE | Freq: Every day | ORAL | 1 refills | Status: DC

## 2018-03-31 NOTE — Telephone Encounter (Signed)
[facility] 

## 2018-04-01 ENCOUNTER — Ambulatory Visit: Payer: Medicare PPO | Admitting: Adult Health

## 2018-04-01 ENCOUNTER — Encounter: Payer: Self-pay | Admitting: Adult Health

## 2018-04-01 VITALS — BP 115/67 | HR 68 | Ht 68.0 in | Wt 161.2 lb

## 2018-04-01 DIAGNOSIS — I10 Essential (primary) hypertension: Secondary | ICD-10-CM | POA: Diagnosis not present

## 2018-04-01 DIAGNOSIS — G5691 Unspecified mononeuropathy of right upper limb: Secondary | ICD-10-CM | POA: Diagnosis not present

## 2018-04-01 DIAGNOSIS — I6381 Other cerebral infarction due to occlusion or stenosis of small artery: Secondary | ICD-10-CM

## 2018-04-01 DIAGNOSIS — E782 Mixed hyperlipidemia: Secondary | ICD-10-CM

## 2018-04-01 MED ORDER — GABAPENTIN 300 MG PO CAPS: 300.0000 mg | ORAL_CAPSULE | Freq: Every day | ORAL | 3 refills | Status: DC

## 2018-04-01 NOTE — Patient Instructions (Signed)
Continue aspirin 325 mg daily  and lipitor 40mg   for secondary stroke prevention  Continue to follow up with PCP regarding cholesterol and blood pressure management  Increase gabapentin dose from 100mg  to 300mg  for continued nerve pain. Follow up with PCP in December for further management  Continue to stay active playing golf and doing activities. This will help continue to strengthen your hand. You have been making a great recovery thus far...keep up the good work!  Continue to monitor blood pressure at home  Maintain strict control of hypertension with blood pressure goal below 130/90, diabetes with hemoglobin A1c goal below 6.5% and cholesterol with LDL cholesterol (bad cholesterol) goal below 70 mg/dL. I also advised the patient to eat a healthy diet with plenty of whole grains, cereals, fruits and vegetables, exercise regularly and maintain ideal body weight.  Followup in the future with me in 6 months or call earlier if needed       Thank you for [facility]. I hope we have been able to provide you high quality care today.  You may receive a patient satisfaction survey over the next few weeks. We would appreciate your feedback and comments so that we may continue to improve ourselves and the health of our patients.

## 2018-04-01 NOTE — Progress Notes (Signed)
[facility] [address] [place]. [place] [address] [phone]       OFFICE CONSULT NOTE  [name] Date of Birth:  [birth year] Medical Record Number:  [id number]   Referring MD: Randall James  Reason for Referral:  stroke  Chief Complaint  Patient presents with  . Follow-up    Stroke follow up room 9 pt with     HPI initial visit 12/30/2017 PS: Mr James is a 82 year African-American male who is accompanied today by his daughter. He is referred for evaluation for stroke. History is obtained from the patient, daughter and review of available medical records from [facility]. hospital in [place]. I have personally reviewed imaging films which are visible on the disc which the patient brought. The patient's the left sudden onset of speech difficulties and trouble swallowing on 09/22/17. He initially saw his primary physician who treated him for UTI but when his symptoms did not improve his wife drove him into the hospital later that day. He describes being seen by tele-stroke specialists who informed him that he was outside the tPA window. CT scan of the head was obtained which I personally reviewed showed old right basal ganglia infarct without acute abnormality. MRI scan of the brain showed a patchy left corona radiata infarct. CT angiogram of the neck showed an ulcerated plaque in the proximal left carotid without significant stenosis. Echocardiogram.was apparently nremarkable. Lipid profile labs are not available but patient was started on Lipitor 40 mg daily as well as Norvasc for hypertension. He was started on dual antiplatelet therapy of aspirin and Plavix which is tolerating well without major bleeding but does complain of increased bruising. Patient got some inpatient therapy and subsequently moved to [facility] to be closer to his daughter. He is recently finished outpatient speech therapy and has opted improvement in his swallowing as well  as speech. He complains of mild weakness in his right hand as well as pain. He has quit chewing tobacco. He wants to drive and play golf. He denies any known prior history of stroke but his CT scan had clearly shown an old right basal ganglia infarct.  Interval history 04/01/18: Patient is being seen today for follow-up visit and is accompanied by his daughter.  He continues to have dysarthria but has been improving and worsens when he is attempting to speak to quickly, tired or stressed.  He also continues to have mild right hand weakness but overall has been improving.  He has returned to playing golf along with driving.  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  He does continue to have intermittent nerve pain that starts in his right fingertips and goes down into his wrist.  His PCP has started him on gabapentin 100 mg nightly which did provide some benefit but he states at times when he goes to grab something with that hand, he experiences a "shock sensation".  He continues to take aspirin 325 mg without side effects of bleeding or bruising.  Continues to take atorvastatin 40 mg daily without side effects of myalgias.  Blood pressure satisfactory at 115/67.  No further concerns at this time.  Denies new or worsening stroke/TIA symptoms.   ROS:   14 system review of systems is positive for chills, frequency of urination and speech difficulties and all other systems negative  PMH:  Past Medical History:  Diagnosis Date  . Hyperlipidemia   . Hypertension   .  Stroke [facility](HCC) 2019    Social History:  Social History   Socioeconomic History  . Marital status: Married    Spouse name: Not on file  . Number of children: Not on file  . Years of education: Not on file  . Highest education level: Not on file  Occupational History  . Not on file  Social Needs  . Financial resource strain: Not on file  . Food insecurity:    Worry: Not on file    Inability: Not on  file  . Transportation needs:    Medical: Not on file    Non-medical: Not on file  Tobacco Use  . Smoking status: Former Games developermoker  . Smokeless tobacco: Former NeurosurgeonUser    Types: Chew  Substance and Sexual Activity  . Alcohol use: Not Currently  . Drug use: Not Currently  . Sexual activity: Not Currently  Lifestyle  . Physical activity:    Days per week: Not on file    Minutes per session: Not on file  . Stress: Not on file  Relationships  . Social connections:    Talks on phone: Not on file    Gets together: Not on file    Attends religious service: Not on file    Active member of club or organization: Not on file    Attends meetings of clubs or organizations: Not on file    Relationship status: Not on file  . Intimate partner violence:    Fear of current or ex partner: Not on file    Emotionally abused: Not on file    Physically abused: Not on file    Forced sexual activity: Not on file  Other Topics Concern  . Not on file  Social History Narrative   Social History      Diet? none      Do you drink/eat things with caffeine? Coffee- yes      Marital status?              married                      What year were you married? 08/08/1973      Do you live in a house, apartment, assisted living, condo, trailer, etc.? house      Is it one or more stories? 2 stories      How many persons live in your home? 2      Do you have any pets in your home? (please list) none      Highest level of education completed? BS in Education and Social Studies      Current or past profession: school teacher      Do you exercise?         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 no      Do you have a DNR form?                                  If not, do you want to discuss one? no      Do you have signed POA/HPOA  for forms? no      Functional  Status      Do you have difficulty bathing or dressing yourself? yes      Do you have difficulty preparing food or eating? yes      Do you have difficulty managing your medications? yes      Do you have difficulty managing your finances? no      Do you have difficulty affording your medications? No     Medications:   Current Outpatient Medications on File Prior to Visit  Medication Sig Dispense Refill  . amLODipine (NORVASC) 10 MG tablet Take 1 tablet (10 mg total) by mouth daily. 90 tablet 0  . aspirin 325 MG EC tablet Take 325 mg by mouth daily.    Marland Kitchen atorvastatin (LIPITOR) 40 MG tablet Take 1 tablet (40 mg total) by mouth daily. 90 tablet 0  . Coenzyme Q10 100 MG capsule Take 2 capsules (200 mg total) by mouth daily. 60 capsule 1  . gabapentin (NEURONTIN) 100 MG capsule Take 1 capsule (100 mg total) by mouth at bedtime. 90 capsule 1   No current facility-administered medications on file prior to visit.     Allergies:  No Known Allergies  Physical Exam General: well developed, well nourished middle-aged African-American male, seated, in no evident distress Head: head normocephalic and atraumatic.   Neck: supple with no carotid or supraclavicular bruits Cardiovascular: regular rate and rhythm, no murmurs Musculoskeletal: no deformity Skin:  no rash/petichiae Vascular:  Normal pulses all extremities  Neurologic Exam Mental Status: Awake and fully alert. Mild dysarthia. Oriented to place and time. Recent and remote memory intact. Attention span, concentration and fund of knowledge appropriate. Mood and affect appropriate. Mild dysarthria and no aphasia. Cranial Nerves: Fundoscopic exam deferred. Pupils equal, briskly reactive to light. Extraocular movements full without nystagmus. Visual fields full to confrontation. Hearing intact. Facial sensation intact. Mild right lower facial weakness., tongue, palate moves normally and symmetrically.  Motor: Normal bulk and tone. Normal  strength in all tested extremity muscles. Mild weakness of right grip  Sensory.: intact to touch , pinprick , position and vibratory sensation.  Coordination: Rapid alternating movements normal in all extremities. Finger-to-nose and heel-to-shin performed accurately bilaterally. Gait and Station: Arises from chair without difficulty. Stance is normal. Gait demonstrates normal stride length and balance . Able to heel, toe and tandem walk without difficulty.  Romberg negative. Reflexes: 1+ and symmetric. Toes downgoing.      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 Vascular risk factors of hypertension, hyperlipidemia, silent cerebrovascular disease, chewing tobacco and left carotid stenosis.  Patient is being seen today for follow-up visit and overall has been stable with mild residual deficit of dysarthria and right hand weakness.    PLAN: 1. Left BG infarct: Continue aspirin 325 mg daily  and lipitor  for secondary stroke prevention. Maintain strict control of hypertension with blood pressure goal below 130/90, diabetes with hemoglobin A1c goal below 6.5% and cholesterol with LDL cholesterol (bad cholesterol) goal below 70 mg/dL.  I also advised the patient to eat a healthy diet with plenty of whole grains, cereals, fruits and vegetables, exercise regularly with at least 30 minutes of continuous activity daily and maintain ideal body weight. 2. Dysarthria and mild right hand weakness: Advised patient to continue to do home therapy exercises that were instructed during therapy sessions for continued deficits.  Overall, he has been improving well compared  to prior visit. 3. HTN: Advised to continue current treatment regimen.  Today's BP 115/67.  Advised to continue to monitor at home along with continued follow-up with PCP for management 4. HLD: Advised to continue current treatment regimen along with continued follow-up with PCP  for future prescribing and monitoring of lipid panel   Greater than 50% of time during this 45 minute consultation visit was spent on counseling and coordination of care about his lacunar stroke discussion about stroke prevention and treatment and answering questions    George Hugh, AGNP-BC  [facility] [address] [place], [place] [address]  Phone [phone] Fax [phone] Note: This document was prepared with digital dictation and possible smart phrase technology. Any transcriptional errors that result from this process are unintentional.

## 2018-04-04 NOTE — Progress Notes (Signed)
I agree with the above plan 

## 2018-04-14 ENCOUNTER — Other Ambulatory Visit: Payer: Self-pay | Admitting: *Deleted

## 2018-04-14 DIAGNOSIS — E782 Mixed hyperlipidemia: Secondary | ICD-10-CM

## 2018-04-14 DIAGNOSIS — I10 Essential (primary) hypertension: Secondary | ICD-10-CM

## 2018-04-14 MED ORDER — AMLODIPINE BESYLATE 10 MG PO TABS: 10.0000 mg | ORAL_TABLET | Freq: Every day | ORAL | 0 refills | Status: DC

## 2018-04-14 MED ORDER — ATORVASTATIN CALCIUM 40 MG PO TABS: 40.0000 mg | ORAL_TABLET | Freq: Every day | ORAL | 0 refills | Status: DC

## 2018-04-14 NOTE — Telephone Encounter (Signed)
[facility] 

## 2018-05-01 ENCOUNTER — Other Ambulatory Visit: Payer: Self-pay | Admitting: *Deleted

## 2018-05-01 DIAGNOSIS — G5691 Unspecified mononeuropathy of right upper limb: Secondary | ICD-10-CM

## 2018-05-01 MED ORDER — GABAPENTIN 300 MG PO CAPS: 300.0000 mg | ORAL_CAPSULE | Freq: Every day | ORAL | 0 refills | Status: DC

## 2018-05-01 NOTE — Telephone Encounter (Signed)
[place] and needs a refill on his Gabapentin. Daughter Requested.

## 2018-05-06 ENCOUNTER — Other Ambulatory Visit: Payer: Self-pay

## 2018-05-06 DIAGNOSIS — I10 Essential (primary) hypertension: Secondary | ICD-10-CM

## 2018-05-06 DIAGNOSIS — E782 Mixed hyperlipidemia: Secondary | ICD-10-CM

## 2018-05-06 DIAGNOSIS — R7303 Prediabetes: Secondary | ICD-10-CM

## 2018-05-07 ENCOUNTER — Other Ambulatory Visit: Payer: Medicare PPO

## 2018-05-07 ENCOUNTER — Encounter: Payer: Self-pay | Admitting: Nurse Practitioner

## 2018-05-07 DIAGNOSIS — R7303 Prediabetes: Secondary | ICD-10-CM

## 2018-05-07 DIAGNOSIS — E782 Mixed hyperlipidemia: Secondary | ICD-10-CM

## 2018-05-07 DIAGNOSIS — I10 Essential (primary) hypertension: Secondary | ICD-10-CM

## 2018-05-08 LAB — COMPLETE METABOLIC PANEL WITH GFR
AG Ratio: 1.5 (calc) (ref 1.0–2.5)
ALT: 13 U/L (ref 9–46)
AST: 16 U/L (ref 10–35)
Albumin: 3.8 g/dL (ref 3.6–5.1)
Alkaline phosphatase (APISO): 87 U/L (ref 40–115)
BUN/Creatinine Ratio: 6 (calc) (ref 6–22)
BUN: 8 mg/dL (ref 7–25)
CO2: 30 mmol/L (ref 20–32)
Calcium: 9.1 mg/dL (ref 8.6–10.3)
Chloride: 106 mmol/L (ref 98–110)
Creat: 1.33 mg/dL — ABNORMAL HIGH (ref 0.70–1.25)
GFR, Est African American: 63 mL/min/{1.73_m2} (ref >=60)
GFR, Est Non African American: 54 mL/min/{1.73_m2} — ABNORMAL LOW (ref >=60)
Globulin: 2.6 g/dL (calc) (ref 1.9–3.7)
Glucose, Bld: 93 mg/dL (ref 65–99)
POTASSIUM: 4.1 mmol/L (ref 3.5–5.3)
Sodium: 142 mmol/L (ref 135–146)
Total Bilirubin: 0.4 mg/dL (ref 0.2–1.2)
Total Protein: 6.4 g/dL (ref 6.1–8.1)

## 2018-05-08 LAB — LIPID PANEL
CHOLESTEROL: 128 mg/dL (ref <200)
HDL: 53 mg/dL (ref >40)
LDL Cholesterol (Calc): 59 mg/dL (calc)
NON-HDL CHOLESTEROL (CALC): 75 mg/dL (ref <130)
Total CHOL/HDL Ratio: 2.4 (calc) (ref <5.0)
Triglycerides: 81 mg/dL (ref <150)

## 2018-05-08 LAB — HEMOGLOBIN A1C
Hgb A1c MFr Bld: 5.8 % of total Hgb — ABNORMAL HIGH (ref <5.7)
Mean Plasma Glucose: 120 (calc)
eAG (mmol/L): 6.6 (calc)

## 2018-05-11 ENCOUNTER — Encounter: Payer: Self-pay | Admitting: Nurse Practitioner

## 2018-05-11 ENCOUNTER — Ambulatory Visit (INDEPENDENT_AMBULATORY_CARE_PROVIDER_SITE_OTHER): Payer: 59 | Admitting: Nurse Practitioner

## 2018-05-11 ENCOUNTER — Ambulatory Visit (INDEPENDENT_AMBULATORY_CARE_PROVIDER_SITE_OTHER): Payer: Medicare PPO

## 2018-05-11 VITALS — BP 122/60 | HR 71 | Temp 97.8°F | Ht 68.0 in | Wt 162.0 lb

## 2018-05-11 DIAGNOSIS — E782 Mixed hyperlipidemia: Secondary | ICD-10-CM

## 2018-05-11 DIAGNOSIS — Z1211 Encounter for screening for malignant neoplasm of colon: Secondary | ICD-10-CM | POA: Diagnosis not present

## 2018-05-11 DIAGNOSIS — Z23 Encounter for immunization: Secondary | ICD-10-CM | POA: Diagnosis not present

## 2018-05-11 DIAGNOSIS — Z0000 Encounter for general adult medical examination without abnormal findings: Secondary | ICD-10-CM

## 2018-05-11 DIAGNOSIS — G5691 Unspecified mononeuropathy of right upper limb: Secondary | ICD-10-CM

## 2018-05-11 DIAGNOSIS — I69322 Dysarthria following cerebral infarction: Secondary | ICD-10-CM

## 2018-05-11 DIAGNOSIS — D649 Anemia, unspecified: Secondary | ICD-10-CM

## 2018-05-11 DIAGNOSIS — I10 Essential (primary) hypertension: Secondary | ICD-10-CM

## 2018-05-11 DIAGNOSIS — R7303 Prediabetes: Secondary | ICD-10-CM

## 2018-05-11 MED ORDER — ZOSTER VAC RECOMB ADJUVANTED 50 MCG/0.5ML IM SUSR: 0.5000 mL | Freq: Once | INTRAMUSCULAR | 1 refills | Status: AC

## 2018-05-11 MED ORDER — TETANUS-DIPHTH-ACELL PERTUSSIS 5-2.5-18.5 LF-MCG/0.5 IM SUSP: 0.5000 mL | Freq: Once | INTRAMUSCULAR | 0 refills | Status: AC

## 2018-05-11 NOTE — Progress Notes (Signed)
Subjective:   Ron ParkerRobert Jerome Szczepanik is a 67 y.o. male who presents for an Initial Medicare Annual Wellness Visit.      Objective:    Today's Vitals   05/11/18 1439  BP: 122/60  Pulse: 71  Temp: 97.8 F (36.6 C)  TempSrc: Oral  SpO2: 98%  Weight: 162 lb (73.5 kg)  Height: 5\' 8"  (1.727 m)   Body mass index is 24.63 kg/m.  Advanced Directives 05/11/2018 01/28/2018 10/24/2017  Does Patient Have a Medical Advance Directive? No No No  Would patient like information on creating a medical advance directive? Yes (MAU/Ambulatory/Procedural Areas - Information given) - Yes (MAU/Ambulatory/Procedural Areas - Information given)    Current Medications (verified) Outpatient Encounter Medications as of 05/11/2018  Medication Sig  . amLODipine (NORVASC) 10 MG tablet Take 1 tablet (10 mg total) by mouth daily.  Marland Kitchen. aspirin 325 MG EC tablet Take 325 mg by mouth daily.  Marland Kitchen. atorvastatin (LIPITOR) 40 MG tablet Take 1 tablet (40 mg total) by mouth daily.  . Cetirizine HCl (ZYRTEC PO) Take by mouth.  . Coenzyme Q10 100 MG capsule Take 2 capsules (200 mg total) by mouth daily.  Marland Kitchen. gabapentin (NEURONTIN) 300 MG capsule Take 1 capsule (300 mg total) by mouth at bedtime.  . Tdap (BOOSTRIX) 5-2.5-18.5 LF-MCG/0.5 injection Inject 0.5 mLs into the muscle once for 1 dose.  Marland Kitchen. Zoster Vaccine Adjuvanted [facility](SHINGRIX) injection Inject 0.5 mLs into the muscle once for 1 dose.  . [DISCONTINUED] Tdap (BOOSTRIX) 5-2.5-18.5 LF-MCG/0.5 injection Inject 0.5 mLs into the muscle once.  . [DISCONTINUED] Zoster Vaccine Adjuvanted [facility](SHINGRIX) injection Inject 0.5 mLs into the muscle once.   No facility-administered encounter medications on file as of 05/11/2018.     Allergies (verified) Patient has no known allergies.   History: Past Medical History:  Diagnosis Date  . Hyperlipidemia   . Hypertension   . Stroke [facility](HCC) 2019   History reviewed. No pertinent surgical history. Family History  Problem Relation Age of Onset   . Cancer Neg Hx   . Hypertension Neg Hx   . Heart disease Neg Hx    Social History   Socioeconomic History  . Marital status: Married    Spouse name: Not on file  . Number of children: Not on file  . Years of education: Not on file  . Highest education level: Not on file  Occupational History  . Not on file  Social Needs  . Financial resource strain: Not hard at all  . Food insecurity:    Worry: Never true    Inability: Never true  . Transportation needs:    Medical: No    Non-medical: No  Tobacco Use  . Smoking status: Former Games developermoker  . Smokeless tobacco: Former NeurosurgeonUser    Types: Chew  Substance and Sexual Activity  . Alcohol use: Not Currently  . Drug use: Not Currently  . Sexual activity: Not Currently  Lifestyle  . Physical activity:    Days per week: 2 days    Minutes per session: 120 min  . Stress: Not at all  Relationships  . Social connections:    Talks on phone: More than three times a week    Gets together: More than three times a week    Attends religious service: More than 4 times per year    Active member of club or organization: Yes    Attends meetings of clubs or organizations: More than 4 times per year    Relationship status: Married  Other Topics Concern  . Not on file  Social History Narrative   Social History      Diet? none      Do you drink/eat things with caffeine? Coffee- yes      Marital status?              married                      What year were you married? 08/08/1973      Do you live in a house, apartment, assisted living, condo, trailer, etc.? house      Is it one or more stories? 2 stories      How many persons live in your home? 2      Do you have any pets in your home? (please list) none      Highest level of education completed? BS in Education and Social Studies      Current or past profession: school teacher      Do you exercise?         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 no      Do you have a DNR form?                                  If not, do you want to discuss one? no      Do you have signed POA/HPOA for forms? no      Functional Status      Do you have difficulty bathing or dressing yourself? yes      Do you have difficulty preparing food or eating? yes      Do you have difficulty managing your medications? yes      Do you have difficulty managing your finances? no      Do you have difficulty affording your medications? No    Tobacco Counseling Counseling given: Not Answered   Clinical Intake:  Pre-visit preparation completed: No  Pain : No/denies pain     Diabetes: No  How often do you need to have someone help you when you read instructions, pamphlets, or other written materials from your doctor or pharmacy?: 1 - Never What is the last grade level you completed in school?: Bachelors  Interpreter Needed?: No  Information entered by :: Tyron RussellSara Cecile Guevara, Rn  Activities of Daily Living In your present state of health, do you have any difficulty performing the following activities: 05/11/2018  Hearing? N  Vision? N  Difficulty concentrating or making decisions? N  Walking or climbing stairs? N  Dressing or bathing? N  Doing errands, shopping? N  Preparing Food and eating ? N  Using the Toilet? N  In the past six months, have you accidently leaked urine? N  Do you have problems with loss of bowel control? N  Managing your Medications? N  Managing your Finances? N  Housekeeping or managing your Housekeeping? N     Immunizations and Health Maintenance Immunization History  Administered Date(s) Administered  . Influenza,inj,Quad PF,6+ Mos 01/28/2018  . Influenza-Unspecified 05/20/2017  . Pneumococcal Conjugate-13  05/11/2018   Health Maintenance Due  Topic Date Due  . Hepatitis C Screening  10-08-1950  .  TETANUS/TDAP  03/31/1968  . COLONOSCOPY  04/01/1999    Patient Care Team: Sharon Seller, NP as PCP - General (Geriatric Medicine)  Indicate any recent Medical Services you may have received from other than [facility] providers in the past year (date may be approximate).    Assessment:   This is a routine wellness examination for Abdul.  Hearing/Vision screen Hearing Screening Comments: Patient reports no issues with hearing Vision Screening Comments: Sees eye doctor annually  Dietary issues and exercise activities discussed: Current Exercise Habits: Home exercise routine, Type of exercise: Other - see comments(basketball), Time (Minutes): 30, Frequency (Times/Week): 2, Weekly Exercise (Minutes/Week): 60, Intensity: Mild, Exercise limited by: None identified  Goals   None    Depression Screen PHQ 2/9 Scores 05/11/2018 10/24/2017  PHQ - 2 Score 0 0    Fall Risk Fall Risk  05/11/2018 04/01/2018 01/28/2018 12/30/2017 10/24/2017  Falls in the past year? 0 0 No No No  Number falls in past yr: 0 - - - -  Injury with Fall? 0 - - - -    Is the patient's home free of loose throw rugs in walkways, pet beds, electrical cords, etc?   yes      Grab bars in the bathroom? no      Handrails on the stairs?   yes      Adequate lighting?   yes  Cognitive Function: MMSE - Mini Mental State Exam 05/11/2018  Orientation to time 5  Orientation to Place 3  Registration 3  Attention/ Calculation 5  Recall 2  Language- name 2 objects 2  Language- repeat 1  Language- follow 3 step command 3  Language- read & follow direction 1  Write a sentence 1  Copy design 0  Total score 26        Screening Tests Health Maintenance  Topic Date Due  . Hepatitis C Screening  07/12/1950  . TETANUS/TDAP  03/31/1968  . COLONOSCOPY  04/01/1999  . PNA vac Low Risk Adult (2 of 2 - PPSV23) 05/12/2019  . INFLUENZA VACCINE  Completed    Qualifies for Shingles Vaccine? Yes, educated and ordered and sent to  pharmacy  Cancer Screenings: Lung: Low Dose CT Chest recommended if Age 10-80 years, 30 pack-year currently smoking OR have quit w/in 15years. Patient does not qualify. Colorectal: due, ordered  Additional Screenings:  Hepatitis C Screening: declined TDAP due: ordered Prevnar due: given today    Plan:  I have personally reviewed and addressed the Medicare Annual Wellness questionnaire and have noted the following in the patient's chart:  A. Medical and social history B. Use of alcohol, tobacco or illicit drugs  C. Current medications and supplements D. Functional ability and status E.  Nutritional status F.  Physical activity G. Advance directives H. List of other physicians I.  Hospitalizations, surgeries, and ER visits in previous 12 months J.  Vitals K. Screenings to include hearing, vision, cognitive, depression L. Referrals and appointments - none  In addition, I have reviewed and discussed with patient certain preventive protocols, quality metrics, and best practice recommendations. A written personalized care plan for preventive services as well as general preventive health recommendations were provided to patient.  See attached scanned questionnaire for additional information.   Signed,   Tyron Russell, RN Nurse Health Advisor  Patient Concerns: none

## 2018-05-11 NOTE — Patient Instructions (Addendum)
Randall James , Thank you for taking time to come for your Medicare Wellness Visit. I appreciate your ongoing commitment to your health goals. Please review the following plan we discussed and let me know if I can assist you in the future.   Screening recommendations/referrals: Colonoscopy due, referral sent. They will call you to schedule Recommended yearly ophthalmology/optometry visit for glaucoma screening and checkup Recommended yearly dental visit for hygiene and checkup  Vaccinations: Influenza vaccine up to date Pneumococcal vaccine 13 given today. Pneumovax will be due 05/12/2019 Tdap vaccine due, ordered to pharmacy Shingles vaccine due, ordered to pharmacy    Advanced directives: Advance directive discussed with you today. I have provided a copy for you to complete at home and have notarized. Once this is complete please bring a copy in to our office so we can scan it into your chart.  Conditions/risks identified: none  Next appointment: Medicare Wellness Visit 05/17/2019 @ 2:15pm  Preventive Care 65 Years and Older, Male Preventive care refers to lifestyle choices and visits with your health care provider that can promote health and wellness. What does preventive care include?  A yearly physical exam. This is also called an annual well check.  Dental exams once or twice a year.  Routine eye exams. Ask your health care provider how often you should have your eyes checked.  Personal lifestyle choices, including:  Daily care of your teeth and gums.  Regular physical activity.  Eating a healthy diet.  Avoiding tobacco and drug use.  Limiting alcohol use.  Practicing safe sex.  Taking low doses of aspirin every day.  Taking vitamin and mineral supplements as recommended by your health care provider. What happens during an annual well check? The services and screenings done by your health care provider during your annual well check will depend on your age, overall  health, lifestyle risk factors, and family history of disease. Counseling  Your health care provider may ask you questions about your:  Alcohol use.  Tobacco use.  Drug use.  Emotional well-being.  Home and relationship well-being.  Sexual activity.  Eating habits.  History of falls.  Memory and ability to understand (cognition).  Work and work Astronomerenvironment. Screening  You may have the following tests or measurements:  Height, weight, and BMI.  Blood pressure.  Lipid and cholesterol levels. These may be checked every 5 years, or more frequently if you are over 69 years old.  Skin check.  Lung cancer screening. You may have this screening every year starting at age 69 if you have a 30-pack-year history of smoking and currently smoke or have quit within the past 15 years.  Fecal occult blood test (FOBT) of the stool. You may have this test every year starting at age 69.  Flexible sigmoidoscopy or colonoscopy. You may have a sigmoidoscopy every 5 years or a colonoscopy every 10 years starting at age 69.  Prostate cancer screening. Recommendations will vary depending on your family history and other risks.  Hepatitis C blood test.  Hepatitis B blood test.  Sexually transmitted disease (STD) testing.  Diabetes screening. This is done by checking your blood sugar (glucose) after you have not eaten for a while (fasting). You may have this done every 1-3 years.  Abdominal aortic aneurysm (AAA) screening. You may need this if you are a current or former smoker.  Osteoporosis. You may be screened starting at age 69 if you are at high risk. Talk with your health care provider about your test  results, treatment options, and if necessary, the need for more tests. Vaccines  Your health care provider may recommend certain vaccines, such as:  Influenza vaccine. This is recommended every year.  Tetanus, diphtheria, and acellular pertussis (Tdap, Td) vaccine. You may need a Td  booster every 10 years.  Zoster vaccine. You may need this after age 72.  Pneumococcal 13-valent conjugate (PCV13) vaccine. One dose is recommended after age 61.  Pneumococcal polysaccharide (PPSV23) vaccine. One dose is recommended after age 22. Talk to your health care provider about which screenings and vaccines you need and how often you need them. This information is not intended to replace advice given to you by your health care provider. Make sure you discuss any questions you have with your health care provider. Document Released: 06/02/2015 Document Revised: 01/24/2016 Document Reviewed: 03/07/2015 Elsevier Interactive Patient Education  2017 [place] Prevention in the Home Falls can cause injuries. They can happen to people of all ages. There are many things you can do to make your home safe and to help prevent falls. What can I do on the outside of my home?  Regularly fix the edges of walkways and driveways and fix any cracks.  Remove anything that might make you trip as you walk through a door, such as a raised step or threshold.  Trim any bushes or trees on the path to your home.  Use bright outdoor lighting.  Clear any walking paths of anything that might make someone trip, such as rocks or tools.  Regularly check to see if handrails are loose or broken. Make sure that both sides of any steps have handrails.  Any raised decks and porches should have guardrails on the edges.  Have any leaves, snow, or ice cleared regularly.  Use sand or salt on walking paths during winter.  Clean up any spills in your garage right away. This includes oil or grease spills. What can I do in the bathroom?  Use night lights.  Install grab bars by the toilet and in the tub and shower. Do not use towel bars as grab bars.  Use non-skid mats or decals in the tub or shower.  If you need to sit down in the shower, use a plastic, non-slip stool.  Keep the floor dry. Clean up  any water that spills on the floor as soon as it happens.  Remove soap buildup in the tub or shower regularly.  Attach bath mats securely with double-sided non-slip rug tape.  Do not have throw rugs and other things on the floor that can make you trip. What can I do in the bedroom?  Use night lights.  Make sure that you have a light by your bed that is easy to reach.  Do not use any sheets or blankets that are too big for your bed. They should not hang down onto the floor.  Have a firm chair that has side arms. You can use this for support while you get dressed.  Do not have throw rugs and other things on the floor that can make you trip. What can I do in the kitchen?  Clean up any spills right away.  Avoid walking on wet floors.  Keep items that you use a lot in easy-to-reach places.  If you need to reach something above you, use a strong step stool that has a grab bar.  Keep electrical cords out of the way.  Do not use floor polish or wax that makes  floors slippery. If you must use wax, use non-skid floor wax.  Do not have throw rugs and other things on the floor that can make you trip. What can I do with my stairs?  Do not leave any items on the stairs.  Make sure that there are handrails on both sides of the stairs and use them. Fix handrails that are broken or loose. Make sure that handrails are as long as the stairways.  Check any carpeting to make sure that it is firmly attached to the stairs. Fix any carpet that is loose or worn.  Avoid having throw rugs at the top or bottom of the stairs. If you do have throw rugs, attach them to the floor with carpet tape.  Make sure that you have a light switch at the top of the stairs and the bottom of the stairs. If you do not have them, ask someone to add them for you. What else can I do to help prevent falls?  Wear shoes that:  Do not have high heels.  Have rubber bottoms.  Are comfortable and fit you well.  Are  closed at the toe. Do not wear sandals.  If you use a stepladder:  Make sure that it is fully opened. Do not climb a closed stepladder.  Make sure that both sides of the stepladder are locked into place.  Ask someone to hold it for you, if possible.  Clearly mark and make sure that you can see:  Any grab bars or handrails.  First and last steps.  Where the edge of each step is.  Use tools that help you move around (mobility aids) if they are needed. These include:  Canes.  Walkers.  Scooters.  Crutches.  Turn on the lights when you go into a dark area. Replace any light bulbs as soon as they burn out.  Set up your furniture so you have a clear path. Avoid moving your furniture around.  If any of your floors are uneven, fix them.  If there are any pets around you, be aware of where they are.  Review your medicines with your doctor. Some medicines can make you feel dizzy. This can increase your chance of falling. Ask your doctor what other things that you can do to help prevent falls. This information is not intended to replace advice given to you by your health care provider. Make sure you discuss any questions you have with your health care provider. Document Released: 03/02/2009 Document Revised: 10/12/2015 Document Reviewed: 06/10/2014 Elsevier Interactive Patient Education  2017 [employer].

## 2018-05-11 NOTE — Patient Instructions (Signed)
Continue to work on hydration    NO TOBACCO!!  Follow up in 6 months with lab work before visit

## 2018-05-11 NOTE — Progress Notes (Signed)
Careteam: Patient Care Team: Sharon SellerEubanks, Jazlynn Nemetz K, NP as PCP - General (Geriatric Medicine)  Advanced Directive information    No Known Allergies  Chief Complaint  Patient presents with  . Medical Management of Chronic Issues    3 month follow up,hyperlipidemia,hx CVA,     HPI: Patient is a 67 y.o. male seen in the office today for routine follow up.   HTN - BP stable on amlodipine.    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 He completed 3 mos plavix and completed ST. He has dysarthria. Followed by neurology Dr Pearlean BrownieSethi. He was released to drive   Hyperlipidemia - no myalgias. Takes lipitor. LDL at goal 59    Tobacco abuse - quit smoking several yrs ago. 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  HENT: Negative for tinnitus.   Respiratory: Negative for cough, sputum production and shortness of breath.   Cardiovascular: Negative for chest pain, palpitations and leg swelling.  Gastrointestinal: Negative for abdominal pain, constipation, diarrhea and heartburn.  Genitourinary: Negative for dysuria, frequency and urgency.  Musculoskeletal: Negative for back pain, falls, joint pain and myalgias.  Skin: Negative.   Neurological: Negative for dizziness and headaches.  Psychiatric/Behavioral: Negative for depression and memory loss. The patient does not have insomnia.     Past Medical History:  Diagnosis Date  . Hyperlipidemia   . Hypertension   . Stroke [facility](H[facility]) 2019   No past surgical history on file. Social History:   reports that he has quit smoking. He has quit using smokeless tobacco.  His smokeless tobacco use included chew. He reports previous alcohol use. He reports previous drug use.  Family History  Problem Relation Age of Onset  . Cancer Neg Hx   .  Hypertension Neg Hx   . Heart disease Neg Hx     Medications: Patient's Medications  New Prescriptions   No medications on file  Previous Medications   AMLODIPINE (NORVASC) 10 MG TABLET    Take 1 tablet (10 mg total) by mouth daily.   ASPIRIN 325 MG EC TABLET    Take 325 mg by mouth daily.   ATORVASTATIN (LIPITOR) 40 MG TABLET    Take 1 tablet (40 mg total) by mouth daily.   CETIRIZINE HCL (ZYRTEC PO)    Take by mouth.   COENZYME Q10 100 MG CAPSULE    Take 2 capsules (200 mg total) by mouth daily.   GABAPENTIN (NEURONTIN) 300 MG CAPSULE    Take 1 capsule (300 mg total) by mouth at bedtime.   TDAP (BOOSTRIX) 5-2.5-18.5 LF-MCG/0.5 INJECTION    Inject 0.5 mLs into the muscle once for 1 dose.   ZOSTER VACCINE ADJUVANTED (SHINGRIX) INJECTION    Inject 0.5 mLs into the muscle once for 1 dose.  Modified Medications   No medications on file  Discontinued Medications   No medications on file     Physical Exam:  Vitals:   05/11/18 1500  BP: 122/60  Pulse: 71  Temp: 97.8 F (36.6 C)  TempSrc: Oral  SpO2: 98%  Weight: 162 lb (73.5 kg)  Height: 5\' 8"  (1.727 m)   Body mass index is 24.63 kg/m.  Physical Exam Constitutional:      Appearance: He is well-developed.  Eyes:     General: No scleral icterus.    Pupils: Pupils are equal, round, and reactive to light.  Neck:     Musculoskeletal: Normal range of motion and neck supple.     Thyroid: No thyromegaly.  Cardiovascular:     Rate and Rhythm: Normal rate and regular rhythm.     Heart sounds: Murmur (1/6 SEM) present. No friction rub. No gallop.   Pulmonary:     Effort: Pulmonary effort is normal.     Breath sounds: Normal breath sounds. No wheezing or rales.  Chest:     Chest wall: No tenderness.  Abdominal:     General: Bowel sounds are normal. There is no distension or abdominal bruit.     Palpations: Abdomen is soft. There is no hepatomegaly, mass or pulsatile mass.     Tenderness: There is no abdominal tenderness.  There is no guarding or rebound.  Skin:    General: Skin is warm and dry.     Findings: No lesion or rash.  Neurological:     Mental Status: He is alert and oriented to person, place, and time.     Cranial Nerves: Cranial nerve deficit (left facial droop) present.     Gait: Gait abnormal (mildly unsteady).     Deep Tendon Reflexes: Reflexes are normal and symmetric.     Comments: Min reduced right grip strength; dysarthria  Psychiatric:        Behavior: Behavior normal.        Thought Content: Thought content normal.        Judgment: Judgment normal.     Labs reviewed: Basic Metabolic Panel: Recent Labs    09/26/17 01/26/18 0820 05/07/18 0803  NA 142 142 142  K 4.0 4.3 4.1  CL  --  105 106  CO2  --  30 30  GLUCOSE  --  82 93  BUN 12 8 8   CREATININE 1.2 1.35* 1.33*  CALCIUM  --  9.3 9.1   Liver Function Tests: Recent Labs    09/26/17 01/26/18 0820 05/07/18 0803  AST 25 15 16   ALT 18 10 13   ALKPHOS 76  --   --   BILITOT  --  0.5 0.4  PROT  --  6.5 6.4   No results for input(s): LIPASE, AMYLASE in the last 8760 hours. No results for input(s): AMMONIA in the last 8760 hours. CBC: Recent Labs    09/26/17 01/26/18 0820  WBC 5.2 4.9  NEUTROABS  --  1,989  HGB 11.8* 12.6*  HCT 36* 39.0  MCV  --  79.6*  PLT 201 189   Lipid Panel: Recent Labs    09/26/17 01/26/18 0820 05/07/18 0803  CHOL 240* 136 128  HDL 49 52 53  LDLCALC 168 66 59  TRIG 112 93 81  CHOLHDL  --  2.6 2.4   TSH: No results for input(s): TSH in the last 8760 hours. A1C: Lab Results  Component Value Date   HGBA1C 5.8 (H) 05/07/2018     Assessment/Plan 1. Essential hypertension Blood pressure at goal continues on norvasc 10 mg daily   2. Mixed hyperlipidemia LDL at goal on last labs, continues on lipitor with dietary modifications - Lipid Panel; Future - COMPLETE METABOLIC PANEL WITH GFR; Future  3. Prediabetes -diet controlled  - Hemoglobin A1c; Future  4. Neuropathy, upper  extremity, right Controlled on gabapentin   5. 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 Continues on ASA 325 mg daily  - CBC with Differential/Platelets; Future  6. Anemia, unspecified type Stable on previous, will follow up cbc on next lab  Next appt: 6 months labs before visit.  Janene HarveyJessica K. Biagio BorgEubanks, AGNP  [facility] [phone]

## 2018-06-29 ENCOUNTER — Encounter: Payer: Self-pay | Admitting: Gastroenterology

## 2018-07-07 ENCOUNTER — Other Ambulatory Visit: Payer: Self-pay | Admitting: *Deleted

## 2018-07-07 DIAGNOSIS — I10 Essential (primary) hypertension: Secondary | ICD-10-CM

## 2018-07-07 DIAGNOSIS — E782 Mixed hyperlipidemia: Secondary | ICD-10-CM

## 2018-07-07 MED ORDER — AMLODIPINE BESYLATE 10 MG PO TABS: 10.0000 mg | ORAL_TABLET | Freq: Every day | ORAL | 1 refills | Status: DC

## 2018-07-07 MED ORDER — ATORVASTATIN CALCIUM 40 MG PO TABS: 40.0000 mg | ORAL_TABLET | Freq: Every day | ORAL | 1 refills | Status: DC

## 2018-07-07 NOTE — Telephone Encounter (Signed)
Patient daughter requested refill.  

## 2018-07-27 ENCOUNTER — Other Ambulatory Visit: Payer: Self-pay

## 2018-07-27 ENCOUNTER — Ambulatory Visit (AMBULATORY_SURGERY_CENTER): Payer: Self-pay | Admitting: *Deleted

## 2018-07-27 VITALS — Ht 68.0 in | Wt 160.0 lb

## 2018-07-27 DIAGNOSIS — Z1211 Encounter for screening for malignant neoplasm of colon: Secondary | ICD-10-CM

## 2018-07-27 MED ORDER — NA SULFATE-K SULFATE-MG SULF 17.5-3.13-1.6 GM/177ML PO SOLN: ORAL | 0 refills | Status: DC

## 2018-07-27 NOTE — Progress Notes (Signed)
No egg or soy allergy  No home oxygen used or hx of sleep apne  No anesthesia or surgical problems  No diet medications  Pt had a CVA 09-22-2017- he is off the Plavix and taking ASA 325 mg po daily

## 2018-08-03 ENCOUNTER — Encounter: Payer: Self-pay | Admitting: Gastroenterology

## 2018-08-12 ENCOUNTER — Telehealth: Payer: Self-pay | Admitting: *Deleted

## 2018-08-12 NOTE — Telephone Encounter (Signed)
LM on VM for patient to call back regarding procedure for 3/30.

## 2018-08-13 NOTE — Telephone Encounter (Signed)
Left another VM advising patient that we are cancelling his appointment for 3/30.  Informed him that we will call back to reschedule.

## 2018-08-13 NOTE — Telephone Encounter (Signed)
Pt returned your call to reschedule

## 2018-08-17 ENCOUNTER — Encounter: Payer: Medicare PPO | Admitting: Gastroenterology

## 2018-09-12 ENCOUNTER — Other Ambulatory Visit: Payer: Self-pay | Admitting: Adult Health

## 2018-09-12 DIAGNOSIS — G5691 Unspecified mononeuropathy of right upper limb: Secondary | ICD-10-CM

## 2018-09-13 ENCOUNTER — Encounter: Payer: Self-pay | Admitting: Nurse Practitioner

## 2018-09-14 ENCOUNTER — Other Ambulatory Visit: Payer: Self-pay | Admitting: *Deleted

## 2018-09-14 DIAGNOSIS — G5691 Unspecified mononeuropathy of right upper limb: Secondary | ICD-10-CM

## 2018-09-14 MED ORDER — GABAPENTIN 300 MG PO CAPS: 300.0000 mg | ORAL_CAPSULE | Freq: Every day | ORAL | 0 refills | Status: DC

## 2018-09-14 NOTE — Telephone Encounter (Signed)
Patient daughter, April requested. Faxed.

## 2018-09-22 ENCOUNTER — Telehealth: Payer: Self-pay | Admitting: *Deleted

## 2018-09-22 NOTE — Telephone Encounter (Signed)
Called daughter to RS colon with Dr Loletha Carrow- pt had a PV 3-9 and daughter states has a Suprep Kit at home from that PV_ Rs to 5-26 Tuesday at 130 pm with Dr Loletha Carrow- arriving at 1230- Informed her that I will mail new instructions to her- Daughter states mail to her address here in [place] because her father is with her until after the [place] is over - address is [address] [place] [id number]-- sent also via My Chart   Marijean Niemann

## 2018-09-25 ENCOUNTER — Other Ambulatory Visit: Payer: Medicare PPO

## 2018-09-25 ENCOUNTER — Other Ambulatory Visit: Payer: Self-pay

## 2018-09-25 DIAGNOSIS — I69322 Dysarthria following cerebral infarction: Secondary | ICD-10-CM

## 2018-09-25 DIAGNOSIS — R7303 Prediabetes: Secondary | ICD-10-CM

## 2018-09-25 DIAGNOSIS — E782 Mixed hyperlipidemia: Secondary | ICD-10-CM

## 2018-09-26 LAB — COMPLETE METABOLIC PANEL WITH GFR
AG Ratio: 1.7 (calc) (ref 1.0–2.5)
ALT: 16 U/L (ref 9–46)
AST: 18 U/L (ref 10–35)
Albumin: 4.5 g/dL (ref 3.6–5.1)
Alkaline phosphatase (APISO): 82 U/L (ref 35–144)
BUN/Creatinine Ratio: 9 (calc) (ref 6–22)
BUN: 14 mg/dL (ref 7–25)
CO2: 32 mmol/L (ref 20–32)
Calcium: 9.7 mg/dL (ref 8.6–10.3)
Chloride: 102 mmol/L (ref 98–110)
Creat: 1.53 mg/dL — ABNORMAL HIGH (ref 0.70–1.25)
GFR, Est African American: 53 mL/min/{1.73_m2} — ABNORMAL LOW (ref >=60)
GFR, Est Non African American: 46 mL/min/{1.73_m2} — ABNORMAL LOW (ref >=60)
Globulin: 2.6 g/dL (calc) (ref 1.9–3.7)
Glucose, Bld: 85 mg/dL (ref 65–99)
Potassium: 4.2 mmol/L (ref 3.5–5.3)
Sodium: 140 mmol/L (ref 135–146)
Total Bilirubin: 0.9 mg/dL (ref 0.2–1.2)
Total Protein: 7.1 g/dL (ref 6.1–8.1)

## 2018-09-26 LAB — CBC WITH DIFFERENTIAL/PLATELET
Absolute Monocytes: 752 cells/uL (ref 200–950)
Basophils Absolute: 48 cells/uL (ref 0–200)
Basophils Relative: 0.7 %
Eosinophils Absolute: 269 cells/uL (ref 15–500)
Eosinophils Relative: 3.9 %
HCT: 42.4 % (ref 38.5–50.0)
Hemoglobin: 14.1 g/dL (ref 13.2–17.1)
Lymphs Abs: 2919 cells/uL (ref 850–3900)
MCH: 26.6 pg — ABNORMAL LOW (ref 27.0–33.0)
MCHC: 33.3 g/dL (ref 32.0–36.0)
MCV: 79.8 fL — ABNORMAL LOW (ref 80.0–100.0)
MPV: 11.3 fL (ref 7.5–12.5)
Monocytes Relative: 10.9 %
Neutro Abs: 2912 cells/uL (ref 1500–7800)
Neutrophils Relative %: 42.2 %
Platelets: 207 10*3/uL (ref 140–400)
RBC: 5.31 10*6/uL (ref 4.20–5.80)
RDW: 14.3 % (ref 11.0–15.0)
Total Lymphocyte: 42.3 %
WBC: 6.9 10*3/uL (ref 3.8–10.8)

## 2018-09-26 LAB — HEMOGLOBIN A1C
Hgb A1c MFr Bld: 5.9 % of total Hgb — ABNORMAL HIGH (ref <5.7)
Mean Plasma Glucose: 123 (calc)
eAG (mmol/L): 6.8 (calc)

## 2018-09-26 LAB — LIPID PANEL
Cholesterol: 174 mg/dL (ref <200)
HDL: 58 mg/dL (ref >=40)
LDL Cholesterol (Calc): 94 mg/dL (calc)
Non-HDL Cholesterol (Calc): 116 mg/dL (calc) (ref <130)
Total CHOL/HDL Ratio: 3 (calc) (ref <5.0)
Triglycerides: 119 mg/dL (ref <150)

## 2018-09-28 ENCOUNTER — Telehealth: Payer: Self-pay | Admitting: Adult Health

## 2018-09-28 NOTE — Telephone Encounter (Signed)
Pts daughter on DPR called to confirm the appt. Daughter consented to a Virtual Visit and for the insurance to be billed as such. She confirmed email that is in chart.

## 2018-09-28 NOTE — Telephone Encounter (Signed)
I called pts daughter April that she will be sent a email day of visit. I stated the device will need to have a camera. The daughter has a [employer]ung phone with a camera on it. She was explain the doxy instructions and verbalized understanding.

## 2018-09-30 ENCOUNTER — Encounter: Payer: Self-pay | Admitting: Nurse Practitioner

## 2018-09-30 ENCOUNTER — Other Ambulatory Visit: Payer: Self-pay

## 2018-09-30 ENCOUNTER — Encounter: Payer: Self-pay | Admitting: Adult Health

## 2018-09-30 ENCOUNTER — Ambulatory Visit (INDEPENDENT_AMBULATORY_CARE_PROVIDER_SITE_OTHER): Payer: Medicare PPO | Admitting: Nurse Practitioner

## 2018-09-30 ENCOUNTER — Ambulatory Visit (INDEPENDENT_AMBULATORY_CARE_PROVIDER_SITE_OTHER): Payer: Medicare PPO | Admitting: Adult Health

## 2018-09-30 VITALS — Wt 156.2 lb

## 2018-09-30 VITALS — BP 110/67

## 2018-09-30 DIAGNOSIS — G5691 Unspecified mononeuropathy of right upper limb: Secondary | ICD-10-CM

## 2018-09-30 DIAGNOSIS — R471 Dysarthria and anarthria: Secondary | ICD-10-CM

## 2018-09-30 DIAGNOSIS — Z1211 Encounter for screening for malignant neoplasm of colon: Secondary | ICD-10-CM

## 2018-09-30 DIAGNOSIS — E782 Mixed hyperlipidemia: Secondary | ICD-10-CM | POA: Diagnosis not present

## 2018-09-30 DIAGNOSIS — I10 Essential (primary) hypertension: Secondary | ICD-10-CM

## 2018-09-30 DIAGNOSIS — I69322 Dysarthria following cerebral infarction: Secondary | ICD-10-CM | POA: Diagnosis not present

## 2018-09-30 DIAGNOSIS — R7303 Prediabetes: Secondary | ICD-10-CM

## 2018-09-30 DIAGNOSIS — I6381 Other cerebral infarction due to occlusion or stenosis of small artery: Secondary | ICD-10-CM | POA: Diagnosis not present

## 2018-09-30 DIAGNOSIS — D649 Anemia, unspecified: Secondary | ICD-10-CM

## 2018-09-30 NOTE — Telephone Encounter (Signed)
I spoke with pts daughter April she receive the email link today for visit. I advise to click  on ten minutes prior to visit. She verbalized understanding.

## 2018-09-30 NOTE — Progress Notes (Signed)
[facility] [address] [place]. [place] [address] [phone]       FOLLOW UP NOTE  [name] Date of Birth:  1950/12/20 Medical Record Number:  [id number]   Referring MD: Randall James  Reason for Referral:  stroke  Virtual Visit via Video Note  I connected with Randall James on 09/30/18 at  1:45 PM EDT by a video enabled telemedicine application located remotely in my own home and verified that I am speaking with the correct person using two identifiers who was located at their own home.   I discussed the limitations of evaluation and management by telemedicine and the availability of in person appointments. The patient expressed understanding and agreed to proceed.    HPI  Randall James is a 68 y.o. male who has been followed in this office for stroke follow-up and was scheduled today for office visit follow-up but due to COVID-19 safety precautions, visit transition to telemedicine via [email or web address] with patient's consent.  He presented to [facility] with speech difficulties and trouble swallowing on 09/22/2017 with a stroke work-up revealing patchy left corona radiata infarct.  Vessel imaging showed ulcerated plaque in the proximal left carotid but otherwise no significant stenosis.  2D echo unremarkable.  Initiated atorvastatin 40 mg along with DAPT and Norvasc for HTN.  Residual deficits of mild right weakness of his right hand and dysarthria.  He denies prior history of stroke but imaging revealed old right basal ganglia infarct.   He continues to have residual deficit of dysarthria with improvement and can worsen when he becomes tired but otherwise endorses full improvement of prior right hand weakness.  He has been living with his daughter for the past 2 months but is able to maintain all ADLs and IADLs independently.  He continues to drive without difficulty.  He continues on aspirin 325 mg without side effects bleeding  or bruising.  Continues on atorvastatin 40 mg with recent lipid panel showing LDL 94 which was obtained by PCP.  He is attempting to lower these numbers through diet change at this time and will have repeat lab in 01/2019.  Blood pressure check prior to visit with reading of 110/67.  He continues on gabapentin 300 mg nightly which continues to benefit him.  No further concerns at this time.  Denies new or worsening stroke/TIA symptoms.    ROS:   14 system review of systems is positive for speech difficulty and all other systems negative  PMH:  Past Medical History:  Diagnosis Date   Allergy    Arthritis    hand   Chronic kidney disease    kidney stone "long time ago"   Hyperlipidemia    Hypertension    Stroke [facility]) 2019   Sep 22, 2017    Social History:  Social History   Socioeconomic History   Marital status: Married    Spouse name: Not on file   Number of children: Not on file   Years of education: Not on file   Highest education level: Not on file  Occupational History   Not on file  Social Needs   Financial resource strain: Not hard at all   Food insecurity:    Worry: Never true    Inability: Never true   Transportation needs:    Medical: No    Non-medical: No  Tobacco Use   Smoking status: Former Smoker   Smokeless tobacco: Former Systems developer    Types: Loss adjuster, chartered  Tobacco comment: no longer using chew since CVA in 2019  Substance and Sexual Activity   Alcohol use: Not Currently   Drug use: Not Currently    Comment: "in college"   Sexual activity: Not Currently  Lifestyle   Physical activity:    Days per week: 2 days    Minutes per session: 120 min   Stress: Not at all  Relationships   Social connections:    Talks on phone: More than three times a week    Gets together: More than three times a week    Attends religious service: More than 4 times per year    Active member of club or organization: Yes    Attends meetings of clubs or  organizations: More than 4 times per year    Relationship status: Married   Intimate partner violence:    Fear of current or ex partner: No    Emotionally abused: No    Physically abused: No    Forced sexual activity: No  Other Topics Concern   Not on file  Social History Narrative   Social History      Diet? none      Do you drink/eat things with caffeine? Coffee- yes      Marital status?              married                      What year were you married? 08/08/1973      Do you live in a house, apartment, assisted living, condo, trailer, etc.? house      Is it one or more stories? 2 stories      How many persons live in your home? 2      Do you have any pets in your home? (please list) none      Highest level of education completed? BS in Education and Social Studies      Current or past profession: school teacher      Do you exercise?         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 no      Do you have a DNR form?                                  If not, do you want to discuss one? no      Do you have signed POA/HPOA for forms? no      Functional Status      Do you have difficulty bathing or dressing yourself? yes      Do you have difficulty preparing food or eating? yes      Do you have difficulty managing your medications? yes      Do you have difficulty managing your finances? no      Do you have difficulty affording your medications? No     Medications:   Current Outpatient Medications on File Prior to Visit  Medication Sig Dispense Refill   amLODipine (NORVASC) 10 MG tablet Take 1 tablet (10 mg total) by mouth daily.  90 tablet 1   aspirin 325 MG EC tablet Take 325 mg by mouth daily.     atorvastatin (LIPITOR) 40 MG tablet Take 1 tablet (40 mg total) by mouth daily. 90 tablet 1   Cetirizine HCl (ZYRTEC  PO) Take by mouth.     Coenzyme Q10 100 MG capsule Take 2 capsules (200 mg total) by mouth daily. 60 capsule 1   gabapentin (NEURONTIN) 300 MG capsule Take 1 capsule (300 mg total) by mouth at bedtime. 90 capsule 0   Na Sulfate-K Sulfate-Mg Sulf (SUPREP BOWEL PREP KIT) 17.5-3.13-1.6 GM/177ML SOLN Suprep as directed, no substitutions (Patient not taking: Reported on 09/30/2018) 354 mL 0   No current facility-administered medications on file prior to visit.     Allergies:  No Known Allergies  Blood pressure 110/67.  Physical Exam General: well developed, well nourished middle-aged African-American male, seated, in no evident distress Head: head normocephalic and atraumatic.    Neurologic Exam Mental Status: Awake and fully alert. Mild dysarthia. Oriented to place and time. Recent and remote memory intact. Attention span, concentration and fund of knowledge appropriate. Mood and affect appropriate.  Cranial Nerves: Extraocular movements full without nystagmus. Hearing intact to voice. Facial sensation intact.  Unable to appreciate previously assessed right lower facial paralysis with video assessment Motor: No evidence of weakness per drift assessment Sensory.: intact to light touch Coordination: Rapid alternating movements normal in all extremities. Finger-to-nose and heel-to-shin performed accurately bilaterally. Gait and Station: Arises from chair without difficulty. Stance is normal. Gait demonstrates normal stride length and balance . Able to heel, toe and tandem walk without difficulty.   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 Vascular risk factors of hypertension, hyperlipidemia, silent cerebrovascular disease, chewing tobacco and left carotid stenosis.  He has been stable from a stroke standpoint with residual deficit of mild dysarthria but denies residual weakness.    PLAN: 1. Left BG  infarct: Continue aspirin 325 mg daily  and lipitor  for secondary stroke prevention. Maintain strict control of hypertension with blood pressure goal below 130/90, diabetes with hemoglobin A1c goal below 6.5% and cholesterol with LDL cholesterol (bad cholesterol) goal below 70 mg/dL.  I also advised the patient to eat a healthy diet with plenty of whole grains, cereals, fruits and vegetables, exercise regularly with at least 30 minutes of continuous activity daily and maintain ideal body weight. 2. Dysarthria: Recommend ongoing speech exercises at home 3. HTN: Advised to continue current treatment regimen.  Advised to continue to monitor at home along with continued follow-up with PCP for management 4. HLD: Advised to continue current treatment regimen along with continued follow-up with PCP for future prescribing and monitoring of lipid panel  Stable from stroke standpoint and will follow-up as needed   Greater than 50% of time during this 25 minute consultation visit was spent on counseling and coordination of care about his lacunar stroke discussion about stroke prevention and treatment and answering questions    Venancio Poisson, AGNP-BC  [facility] [address] [place], [place] [address]  Phone [phone] Fax [phone] Note: This document was prepared with digital dictation and possible smart phrase technology. Any transcriptional errors that result from this process are unintentional.

## 2018-09-30 NOTE — Patient Instructions (Addendum)
To work on diet modifications.  Low cholesterol heart healthy diet.   heart healthy diet, avoid sweets and to control the amount of carbohydrates you eat (starchy foods such as flour, bread, and potatoes), avoid high fat dairy (like ice cream, whole milk, cheeses), can buy a heart healthy cook book from the american heart association to help with meal planning.   Increase water intake for proper hydration and to avoid NSAIDS (Aleve, Advil, Motrin, Ibuprofen)   Follow up in 4 months with lab work before visit.  Heart-Healthy Eating Plan Many factors influence your heart (coronary) health, including eating and exercise habits. Coronary risk increases with abnormal blood fat (lipid) levels. Heart-healthy meal planning includes limiting unhealthy fats, increasing healthy fats, and making other diet and lifestyle changes.  What are tips for following this plan? Cooking Cook foods using methods other than frying. Baking, boiling, grilling, and broiling are all good options. Other ways to reduce fat include:  Removing the skin from poultry.  Removing all visible fats from meats.  Steaming vegetables in water or broth. Meal planning   At meals, imagine dividing your plate into fourths: ? Fill one-half of your plate with vegetables and green salads. ? Fill one-fourth of your plate with whole grains. ? Fill one-fourth of your plate with lean protein foods.  Eat 4-5 servings of vegetables per day. One serving equals 1 cup raw or cooked vegetable, or 2 cups raw leafy greens.  Eat 4-5 servings of fruit per day. One serving equals 1 medium whole fruit,  cup dried fruit,  cup fresh, frozen, or canned fruit, or  cup 100% fruit juice.  Eat more foods that contain soluble fiber. Examples include apples, broccoli, carrots, beans, peas, and barley. Aim to get 25-30 g of fiber per day.  Increase your consumption of legumes, nuts, and seeds to 4-5 servings per week. One serving of dried beans or  legumes equals  cup cooked, 1 serving of nuts is  cup, and 1 serving of seeds equals 1 tablespoon. Fats  Choose healthy fats more often. Choose monounsaturated and polyunsaturated fats, such as olive and canola oils, flaxseeds, walnuts, almonds, and seeds.  Eat more omega-3 fats. Choose salmon, mackerel, sardines, tuna, flaxseed oil, and ground flaxseeds. Aim to eat fish at least 2 times each week.  Check food labels carefully to identify foods with trans fats or high amounts of saturated fat.  Limit saturated fats. These are found in animal products, such as meats, butter, and cream. Plant sources of saturated fats include palm oil, palm kernel oil, and coconut oil.  Avoid foods with partially hydrogenated oils in them. These contain trans fats. Examples are stick margarine, some tub margarines, cookies, crackers, and other baked goods.  Avoid fried foods. General information  Eat more home-cooked food and less restaurant, buffet, and fast food.  Limit or avoid alcohol.  Limit foods that are high in starch and sugar.  Lose weight if you are overweight. Losing just 5-10% of your body weight can help your overall health and prevent diseases such as diabetes and heart disease.  Monitor your salt (sodium) intake, especially if you have high blood pressure. Talk with your health care provider about your sodium intake.  Try to incorporate more vegetarian meals weekly. What foods can I eat? Fruits All fresh, canned (in natural juice), or frozen fruits. Vegetables Fresh or frozen vegetables (raw, steamed, roasted, or grilled). Green salads. Grains Most grains. Choose whole wheat and whole grains most of the time.  Rice and pasta, including brown rice and pastas made with whole wheat. Meats and other proteins Lean, well-trimmed beef, veal, pork, and lamb. Chicken and [place] without skin. All fish and shellfish. Wild duck, rabbit, pheasant, and venison. Egg whites or low-cholesterol egg  substitutes. Dried beans, peas, lentils, and tofu. Seeds and most nuts. Dairy Low-fat or nonfat cheeses, including ricotta and mozzarella. Skim or 1% milk (liquid, powdered, or evaporated). Buttermilk made with low-fat milk. Nonfat or low-fat yogurt. Fats and oils Non-hydrogenated (trans-free) margarines. Vegetable oils, including soybean, sesame, sunflower, olive, peanut, safflower, corn, canola, and cottonseed. Salad dressings or mayonnaise made with a vegetable oil. Beverages Water (mineral or sparkling). Coffee and tea. Diet carbonated beverages. Sweets and desserts Sherbet, gelatin, and fruit ice. Small amounts of dark chocolate. Limit all sweets and desserts. Seasonings and condiments All seasonings and condiments. The items listed above may not be a complete list of foods and beverages you can eat. Contact a dietitian for more options. What foods are not recommended? Fruits Canned fruit in heavy syrup. Fruit in cream or butter sauce. Fried fruit. Limit coconut. Vegetables Vegetables cooked in cheese, cream, or butter sauce. Fried vegetables. Grains Breads made with saturated or trans fats, oils, or whole milk. Croissants. Sweet rolls. Donuts. High-fat crackers, such as cheese crackers. Meats and other proteins Fatty meats, such as hot dogs, ribs, sausage, bacon, rib-eye roast or steak. High-fat deli meats, such as salami and bologna. Caviar. Domestic duck and goose. Organ meats, such as liver. Dairy Cream, sour cream, cream cheese, and creamed cottage cheese. Whole milk cheeses. Whole or 2% milk (liquid, evaporated, or condensed). Whole buttermilk. Cream sauce or high-fat cheese sauce. Whole-milk yogurt. Fats and oils Meat fat, or shortening. Cocoa butter, hydrogenated oils, palm oil, coconut oil, palm kernel oil. Solid fats and shortenings, including bacon fat, salt pork, lard, and butter. Nondairy cream substitutes. Salad dressings with cheese or sour cream. Beverages Regular  sodas and any drinks with added sugar. Sweets and desserts Frosting. Pudding. Cookies. Cakes. Pies. Milk chocolate or white chocolate. Buttered syrups. Full-fat ice cream or ice cream drinks. The items listed above may not be a complete list of foods and beverages to avoid. Contact a dietitian for more information. Summary  Heart-healthy meal planning includes limiting unhealthy fats, increasing healthy fats, and making other diet and lifestyle changes.  Lose weight if you are overweight. Losing just 5-10% of your body weight can help your overall health and prevent diseases such as diabetes and heart disease.  Focus on eating a balance of foods, including fruits and vegetables, low-fat or nonfat dairy, lean protein, nuts and legumes, whole grains, and heart-healthy oils and fats. This information is not intended to replace advice given to you by your health care provider. Make sure you discuss any questions you have with your health care provider. Document Released: 02/13/2008 Document Revised: 06/13/2017 Document Reviewed: 06/13/2017 Elsevier Interactive Patient Education  2019 [employer].

## 2018-09-30 NOTE — Progress Notes (Signed)
This service is provided via telemedicine  No vital signs collected/recorded due to the encounter was a telemedicine visit.   Location of patient (ex: home, work):  No  Patient consents to a telephone visit:  Yes  Location of the provider (ex: office, home):[facility], Office   Name of any referring provider: N/A  Names of all persons participating in the telemedicine service and their role in the encounter:  S.Chrae B/CMA, Sherrie Mustache, NP, daughter (April Mitchell) and Patient   Time spent on call:  7 min with medical assistant     Virtual Visit via Telephone Note  I connected with Randall James on 09/30/18 at 10:30 AM EDT by telephone and verified that I am speaking with the correct person using two identifiers.  Location: Patient: home Provider: office      Careteam: Patient Care Team: Lauree Chandler, NP as PCP - General (Geriatric Medicine)  Advanced Directive information Does Patient Have a Medical Advance Directive?: Yes, Does patient want to make changes to medical advance directive?: Yes (MAU/Ambulatory/Procedural Areas - Information given)  No Known Allergies  Chief Complaint  Patient presents with   Medical Management of Chronic Issues    5 month follow-up and discuss labs (patient can view on mychart). Telephone Visit    Best Practice Recommendations    Discuss need for hep C screening and colonoscopy   Immunizations    Discuss need for TDaP      HPI: Patient is a 68 y.o. male for routine follow up. Doing well at home. More snacking during quarantine.   HTN - 110/67 on home meter, BP controlled. on amlodipine.   Hx CVA(09/2017; right basal ganglia)- stable on ASA 39m daily.  He has dysarthria and has completed ST. Followed byneurology Dr SLeonie Man He was released to drive and continues to do so has not gone back to work.  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   Hyperlipidemia - no myalgias. Takes lipitor 490m LDL up at 94 from 59.   Tobacco abuse - quit smoking several yrs ago.He no longer chews tobacco  CKD -stage 3. Cr 1.53, only drinking 16 oz daily of water, 1 cup of coffee, 1 diet mtn or coke zero  Prediabetes - A1c 5.9% fasting glucose 85   Anemia - stable; Hgb 14.1.   Seasonal allergies- uses zyrtec as needed, not having issues at this time.   discussed labs  tdap has already been sent to pharmacy in the past.   Waiting for COVID-19 to pass more before before doing colonoscopy. Daughter plans to push it back.   Review of Systems:  Review of Systems  Constitutional: Negative for chills, fever and weight loss.  HENT: Negative for tinnitus.   Respiratory: Negative for cough, sputum production and shortness of breath.   Cardiovascular: Negative for chest pain, palpitations and leg swelling.  Gastrointestinal: Negative for abdominal pain, constipation, diarrhea and heartburn.  Genitourinary: Negative for dysuria, frequency and urgency.  Musculoskeletal: Negative for back pain, falls, joint pain and myalgias.  Skin: Negative.   Neurological: Negative for dizziness and headaches.  Psychiatric/Behavioral: Negative for depression and memory loss. The patient does not have insomnia.     Past Medical History:  Diagnosis Date   Allergy    Arthritis    hand   Chronic kidney disease    kidney stone "long time ago"   Hyperlipidemia    Hypertension  Stroke [facility]) 2019   Sep 22, 2017   Past Surgical History:  Procedure Laterality Date   sarcidosis surgery- 1984     pt states he isn't sure what was involved with this, possible lymph nodes removed   Social History:   reports that he has quit smoking. He has quit using smokeless tobacco.  His smokeless tobacco use included chew. He reports previous alcohol use. He reports previous drug use.  Family History  Problem Relation Age of Onset   Pancreatic  cancer Mother    Cancer Neg Hx    Hypertension Neg Hx    Heart disease Neg Hx    Colon cancer Neg Hx    Esophageal cancer Neg Hx    Rectal cancer Neg Hx    Stomach cancer Neg Hx     Medications: Patient's Medications  New Prescriptions   No medications on file  Previous Medications   AMLODIPINE (NORVASC) 10 MG TABLET    Take 1 tablet (10 mg total) by mouth daily.   ASPIRIN 325 MG EC TABLET    Take 325 mg by mouth daily.   ATORVASTATIN (LIPITOR) 40 MG TABLET    Take 1 tablet (40 mg total) by mouth daily.   CETIRIZINE HCL (ZYRTEC PO)    Take by mouth.   COENZYME Q10 100 MG CAPSULE    Take 2 capsules (200 mg total) by mouth daily.   GABAPENTIN (NEURONTIN) 300 MG CAPSULE    Take 1 capsule (300 mg total) by mouth at bedtime.   NA SULFATE-K SULFATE-MG SULF (SUPREP BOWEL PREP KIT) 17.5-3.13-1.6 GM/177ML SOLN    Suprep as directed, no substitutions  Modified Medications   No medications on file  Discontinued Medications   No medications on file     Physical Exam: unable due to videovisit.     Labs reviewed: Basic Metabolic Panel: Recent Labs    01/26/18 0820 05/07/18 0803 09/25/18 0802  NA 142 142 140  K 4.3 4.1 4.2  CL 105 106 102  CO2 30 30 32  GLUCOSE 82 93 85  BUN _0 CREATININE 1.35* 1.33* 1.53*  CALCIUM 9.3 9.1 9.7   Liver Function Tests: Recent Labs    01/26/18 0820 05/07/18 0803 09/25/18 0802  AST _1 ALT _2 BILITOT 0.5 0.4 0.9  PROT 6.5 6.4 7.1   No results for input(s): LIPASE, AMYLASE in the last 8760 hours. No results for input(s): AMMONIA in the last 8760 hours. CBC: Recent Labs    01/26/18 0820 09/25/18 0802  WBC 4.9 6.9  NEUTROABS 1,989 2,912  HGB 12.6* 14.1  HCT 39.0 42.4  MCV 79.6* 79.8*  PLT 189 207   Lipid Panel: Recent Labs    01/26/18 0820 05/07/18 0803 09/25/18 0802  CHOL 136 128 174  HDL 52 53 58  LDLCALC 66 59 94  TRIG 93 81 119  CHOLHDL 2.6 2.4 3.0   TSH: No results for input(s): TSH in the  last 8760 hours. A1C: Lab Results  Component Value Date   HGBA1C 5.9 (H) 09/25/2018     Assessment/Plan 1. 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   2. Essential hypertension Controlled on norvasc 10 mg daily  3. Mixed hyperlipidemia LDL not at goal, diet has been off due to quarantine. Encouraged dietary modifications and to continue lipitor 40 mg daily - COMPLETE METABOLIC PANEL WITH GFR; Future - Lipid Panel; Future  4. Screen  for colon cancer Plans to get colonoscopy but holding off for now due to COVID-19.   5. Prediabetes Stable,  Encouraged dietary modifications, lab reviewed with pt  6. Neuropathy, upper extremity, right Stable on gabapentin.  7. Anemia, unspecified type Stable, hgb improved on recent labs, no signs of blood loss.  - CBC with Differential/Platelet; Future   Next appt: 4 months with lab work before visit.  Carlos American. Shelton Soler, [facility] [phone]    I discussed the limitations, risks, security and privacy concerns of performing an evaluation and management service by telephone and the availability of in person appointments. I also discussed with the patient that there may be a patient responsible charge related to this service. The patient expressed understanding and agreed to proceed.   Follow Up Instructions:    I discussed the assessment and treatment plan with the patient. The patient was provided an opportunity to ask questions and all were answered. The patient agreed with the plan and demonstrated an understanding of the instructions.   The patient was advised to call back or seek an in-person evaluation if the symptoms worsen or if the condition fails to improve as anticipated.  I provided 25 minutes of non-face-to-face time during this encounter.   Lauree Chandler, NP avs printed and mailed.

## 2018-10-02 NOTE — Progress Notes (Signed)
I agree with the above plan 

## 2018-10-13 ENCOUNTER — Encounter: Payer: Medicare PPO | Admitting: Gastroenterology

## 2018-12-18 ENCOUNTER — Encounter: Payer: Self-pay | Admitting: Nurse Practitioner

## 2018-12-18 DIAGNOSIS — I10 Essential (primary) hypertension: Secondary | ICD-10-CM

## 2018-12-18 DIAGNOSIS — G5691 Unspecified mononeuropathy of right upper limb: Secondary | ICD-10-CM

## 2018-12-18 DIAGNOSIS — E782 Mixed hyperlipidemia: Secondary | ICD-10-CM

## 2018-12-18 MED ORDER — ATORVASTATIN CALCIUM 40 MG PO TABS: 40.0000 mg | ORAL_TABLET | Freq: Every day | ORAL | 0 refills | Status: DC

## 2018-12-18 MED ORDER — GABAPENTIN 300 MG PO CAPS: 300.0000 mg | ORAL_CAPSULE | Freq: Every day | ORAL | 0 refills | Status: DC

## 2018-12-18 MED ORDER — AMLODIPINE BESYLATE 10 MG PO TABS: 10.0000 mg | ORAL_TABLET | Freq: Every day | ORAL | 0 refills | Status: DC

## 2019-01-14 IMAGING — RF DG SWALLOWING FUNCTION
13 series · 24 of 24 positions shown · non-contrast
Comparison: None.

CLINICAL DATA: Basal ganglia stroke earlier in the month, outside
swallowing function study demonstrated various abnormalities.
Today's exam is for follow up into assess for any improvement.

EXAM:
MODIFIED BARIUM SWALLOW
TECHNIQUE: Different consistencies of barium were administered orally to the
patient by the Speech Pathologist. Imaging of the pharynx was
performed in the lateral projection.
FLUOROSCOPY TIME:  Fluoroscopy Time:  1 minutes, 12 seconds
Radiation Exposure Index (if provided by the fluoroscopic device):
2.7 mGy
Number of Acquired Spot Images: 0

[Series 1: cp_standard · 0.36mm/px · 2 of 55 frames shown (1 of 13)]
[frame 9/55]
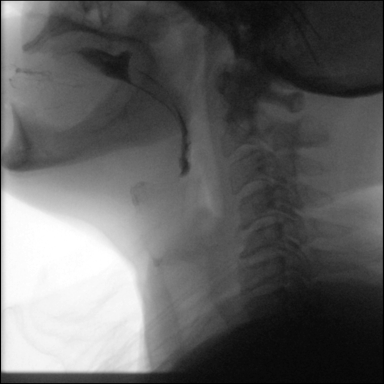
[frame 47/55]
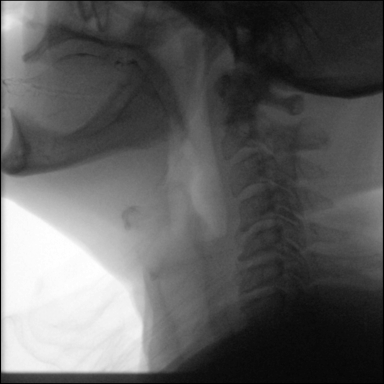

[Series 2: cp_standard · 0.36mm/px · 2 of 27 frames shown (2 of 13)]
[frame 5/27]
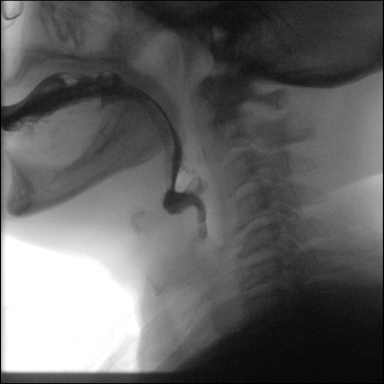
[frame 27/27]
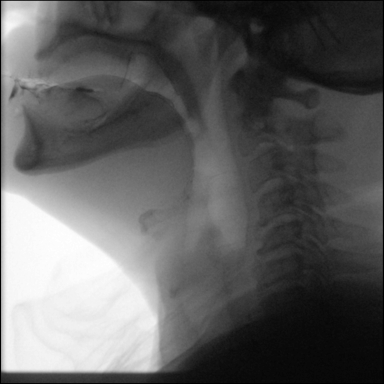

[Series 3: cp_standard · 0.36mm/px · 2 of 42 frames shown (3 of 13)]
[frame 22/42]
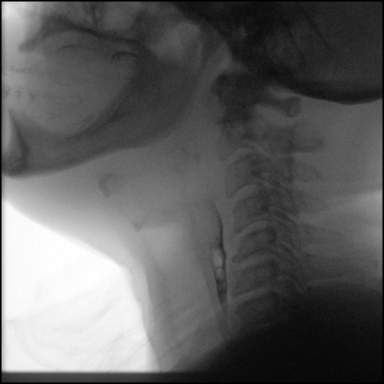
[frame 36/42]
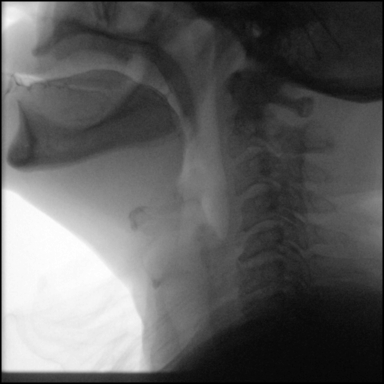

[Series 4: cp_standard · 0.36mm/px · 1 of 44 frames shown (4 of 13)]
[frame 23/44]
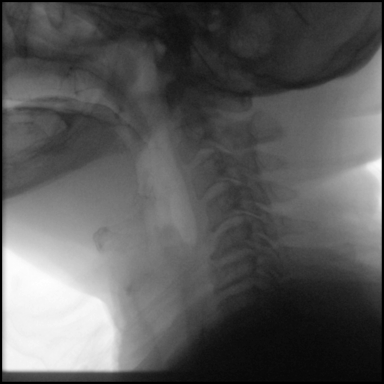

[Series 5: cp_standard · 0.36mm/px · 2 of 31 frames shown (5 of 13)]
[frame 5/31]
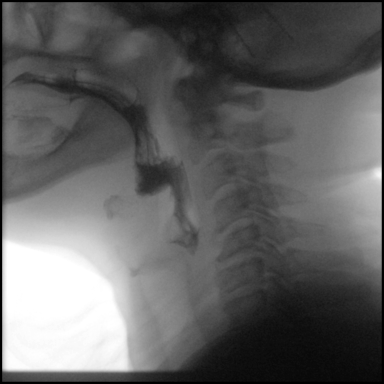
[frame 27/31]
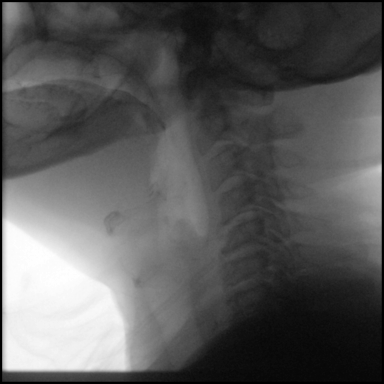

[Series 6: cp_standard · 0.36mm/px · 2 of 166 frames shown (6 of 13)]
[frame 25/166]
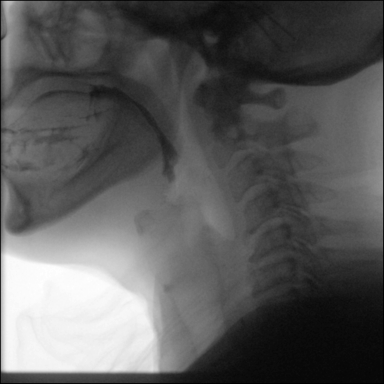
[frame 91/166]
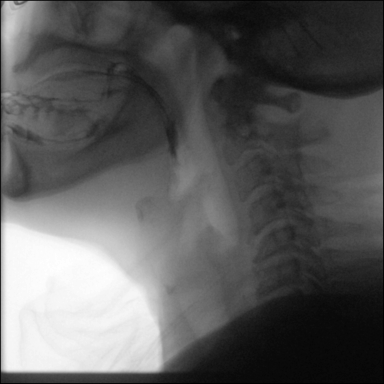

[Series 7: cp_standard · 0.36mm/px · 2 of 67 frames shown (7 of 13)]
[frame 11/67]
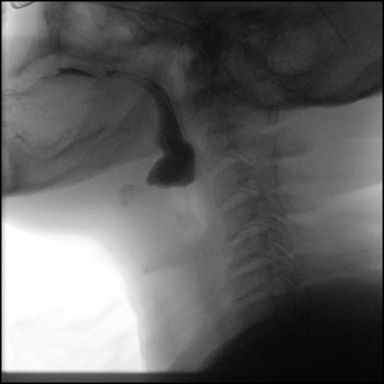
[frame 57/67]
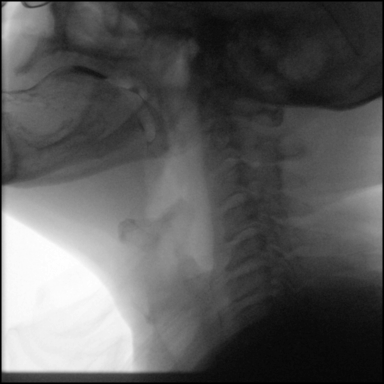

[Series 8: cp_standard · 0.36mm/px · 2 of 20 frames shown (8 of 13)]
[frame 9/20]
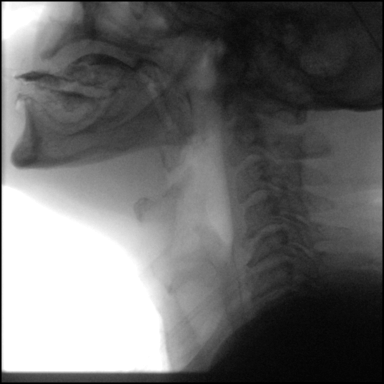
[frame 18/20]
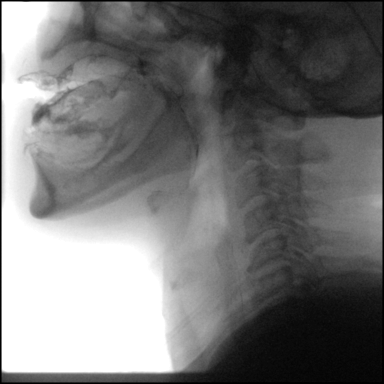

[Series 9: cp_standard · 0.36mm/px · 2 of 127 frames shown (9 of 13)]
[frame 64/127]
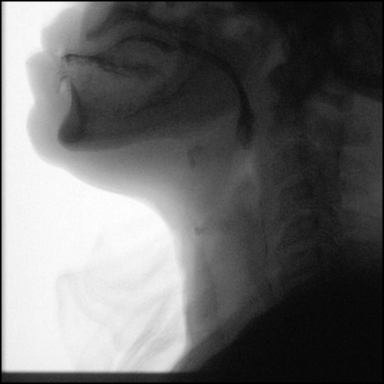
[frame 108/127]
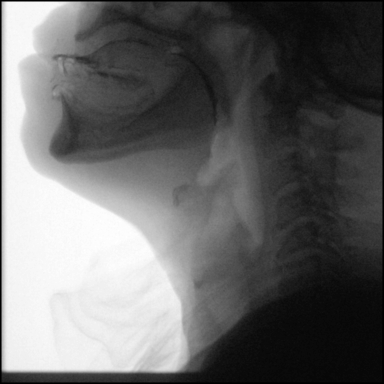

[Series 10: cp_standard · 0.36mm/px · 1 of 37 frames shown (10 of 13)]
[frame 27/37]
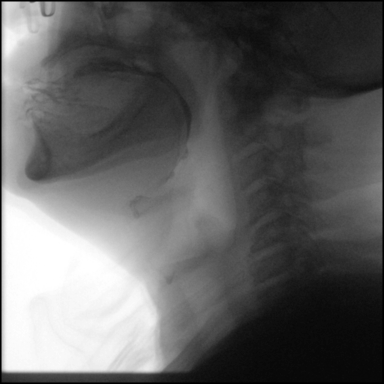

[Series 11: cp_standard · 0.36mm/px · 2 of 92 frames shown (11 of 13)]
[frame 14/92]
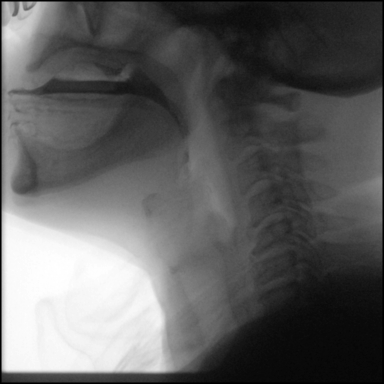
[frame 79/92]
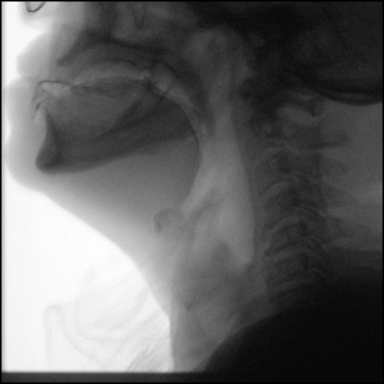

[Series 12: cp_standard · 0.36mm/px · 2 of 113 frames shown (12 of 13)]
[frame 17/113]
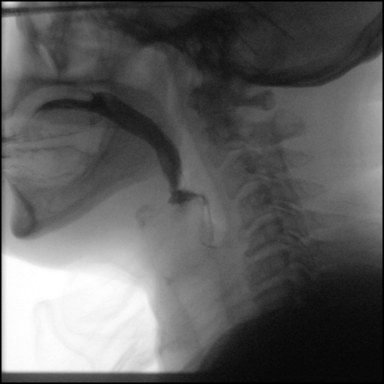
[frame 97/113]
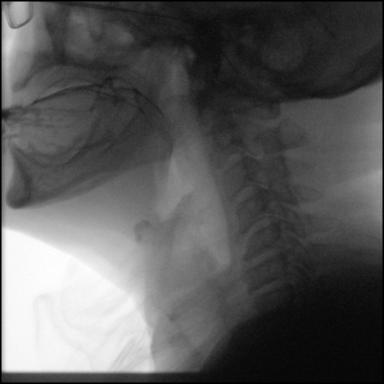

[Series 13: cp_standard · 0.36mm/px · 2 of 47 frames shown (13 of 13)]
[frame 24/47]
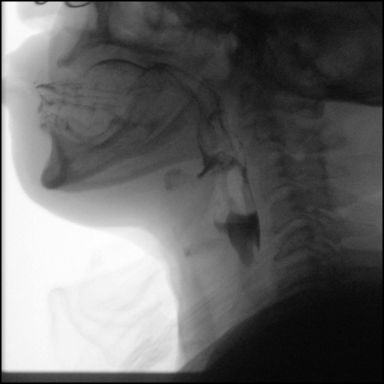
[frame 41/47]
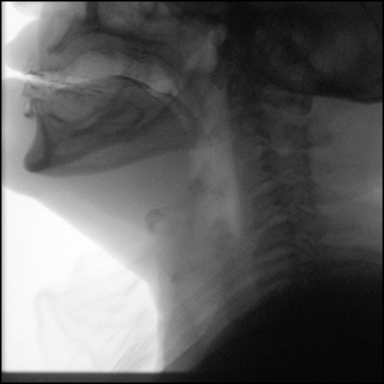

[24 of 24 positions shown; findings below may reference images not displayed]

FINDINGS: Thin liquid: Delayed swallowing trigger. Minimal flash penetration
with 1 cup swallow. Minimal flash penetration with 1 swallow from
the straw. Otherwise multiple thin liquid swallows without
penetration.

Nectar thick: Delayed swallowing trigger.

Puree: Mildly delayed swallowing trigger.

Pure a with cracker: Unremarkable
IMPRESSION: 1. Delayed swallowing trigger with various consistencies. Transient
flash penetration on 1 cup swallow of thin liquid and with one straw
swallow of thin liquid, although multiple swallows of thin liquid
were without flash penetration.

Please refer to the Speech Pathologists report for complete details
and recommendations.

## 2019-01-16 ENCOUNTER — Encounter: Payer: Self-pay | Admitting: Nurse Practitioner

## 2019-01-17 ENCOUNTER — Encounter: Payer: Self-pay | Admitting: Nurse Practitioner

## 2019-01-17 DIAGNOSIS — E782 Mixed hyperlipidemia: Secondary | ICD-10-CM

## 2019-01-17 DIAGNOSIS — I10 Essential (primary) hypertension: Secondary | ICD-10-CM

## 2019-01-18 MED ORDER — ATORVASTATIN CALCIUM 40 MG PO TABS: 40.0000 mg | ORAL_TABLET | Freq: Every day | ORAL | 0 refills | Status: DC

## 2019-01-18 MED ORDER — AMLODIPINE BESYLATE 10 MG PO TABS: 10.0000 mg | ORAL_TABLET | Freq: Every day | ORAL | 0 refills | Status: DC

## 2019-01-26 ENCOUNTER — Other Ambulatory Visit: Payer: Medicare PPO

## 2019-01-29 ENCOUNTER — Ambulatory Visit: Payer: Medicare PPO | Admitting: Nurse Practitioner

## 2019-02-08 ENCOUNTER — Other Ambulatory Visit: Payer: Medicare PPO

## 2019-02-11 ENCOUNTER — Ambulatory Visit: Payer: Medicare PPO | Admitting: Nurse Practitioner

## 2019-02-22 ENCOUNTER — Other Ambulatory Visit: Payer: Medicare PPO

## 2019-02-25 ENCOUNTER — Ambulatory Visit: Payer: Medicare PPO | Admitting: Family

## 2019-02-25 ENCOUNTER — Ambulatory Visit: Payer: Medicare PPO | Admitting: Nurse Practitioner

## 2019-03-01 ENCOUNTER — Other Ambulatory Visit: Payer: Medicare PPO

## 2019-03-05 ENCOUNTER — Ambulatory Visit: Payer: Medicare PPO | Admitting: Nurse Practitioner

## 2019-03-08 ENCOUNTER — Other Ambulatory Visit: Payer: Self-pay

## 2019-03-08 ENCOUNTER — Other Ambulatory Visit: Payer: Medicare PPO

## 2019-03-08 DIAGNOSIS — D649 Anemia, unspecified: Secondary | ICD-10-CM

## 2019-03-08 DIAGNOSIS — E782 Mixed hyperlipidemia: Secondary | ICD-10-CM

## 2019-03-10 LAB — LIPID PANEL
Cholesterol: 162 mg/dL (ref <200)
HDL: 59 mg/dL (ref >=40)
LDL Cholesterol (Calc): 82 mg/dL (calc)
Non-HDL Cholesterol (Calc): 103 mg/dL (calc) (ref <130)
Total CHOL/HDL Ratio: 2.7 (calc) (ref <5.0)
Triglycerides: 113 mg/dL (ref <150)

## 2019-03-10 LAB — CBC WITH DIFFERENTIAL/PLATELET
Absolute Monocytes: 482 cells/uL (ref 200–950)
Basophils Absolute: 28 cells/uL (ref 0–200)
Basophils Relative: 0.5 %
Eosinophils Absolute: 174 cells/uL (ref 15–500)
Eosinophils Relative: 3.1 %
HCT: 44.3 % (ref 38.5–50.0)
Hemoglobin: 13.8 g/dL (ref 13.2–17.1)
Lymphs Abs: 2615 cells/uL (ref 850–3900)
MCH: 25.8 pg — ABNORMAL LOW (ref 27.0–33.0)
MCHC: 31.2 g/dL — ABNORMAL LOW (ref 32.0–36.0)
MCV: 83 fL (ref 80.0–100.0)
MPV: 12.7 fL — ABNORMAL HIGH (ref 7.5–12.5)
Monocytes Relative: 8.6 %
Neutro Abs: 2302 cells/uL (ref 1500–7800)
Neutrophils Relative %: 41.1 %
Platelets: 195 10*3/uL (ref 140–400)
RBC: 5.34 10*6/uL (ref 4.20–5.80)
RDW: 13.7 % (ref 11.0–15.0)
Total Lymphocyte: 46.7 %
WBC: 5.6 10*3/uL (ref 3.8–10.8)

## 2019-03-10 LAB — COMPLETE METABOLIC PANEL WITH GFR
AG Ratio: 1.4 (calc) (ref 1.0–2.5)
ALT: 12 U/L (ref 9–46)
AST: 18 U/L (ref 10–35)
Albumin: 4.2 g/dL (ref 3.6–5.1)
Alkaline phosphatase (APISO): 91 U/L (ref 35–144)
BUN/Creatinine Ratio: 9 (calc) (ref 6–22)
BUN: 12 mg/dL (ref 7–25)
CO2: 21 mmol/L (ref 20–32)
Calcium: 9.9 mg/dL (ref 8.6–10.3)
Chloride: 106 mmol/L (ref 98–110)
Creat: 1.38 mg/dL — ABNORMAL HIGH (ref 0.70–1.25)
GFR, Est African American: 60 mL/min/{1.73_m2} (ref >=60)
GFR, Est Non African American: 52 mL/min/{1.73_m2} — ABNORMAL LOW (ref >=60)
Globulin: 3 g/dL (calc) (ref 1.9–3.7)
Glucose, Bld: 93 mg/dL (ref 65–99)
Potassium: 5.1 mmol/L (ref 3.5–5.3)
Sodium: 143 mmol/L (ref 135–146)
Total Bilirubin: 0.5 mg/dL (ref 0.2–1.2)
Total Protein: 7.2 g/dL (ref 6.1–8.1)

## 2019-03-12 ENCOUNTER — Ambulatory Visit (INDEPENDENT_AMBULATORY_CARE_PROVIDER_SITE_OTHER): Payer: Medicare PPO | Admitting: Nurse Practitioner

## 2019-03-12 ENCOUNTER — Encounter: Payer: Self-pay | Admitting: Nurse Practitioner

## 2019-03-12 ENCOUNTER — Other Ambulatory Visit: Payer: Self-pay

## 2019-03-12 VITALS — BP 124/80 | HR 81 | Temp 97.8°F | Ht 68.0 in | Wt 159.2 lb

## 2019-03-12 DIAGNOSIS — I10 Essential (primary) hypertension: Secondary | ICD-10-CM

## 2019-03-12 DIAGNOSIS — E782 Mixed hyperlipidemia: Secondary | ICD-10-CM

## 2019-03-12 DIAGNOSIS — I69322 Dysarthria following cerebral infarction: Secondary | ICD-10-CM

## 2019-03-12 DIAGNOSIS — G5691 Unspecified mononeuropathy of right upper limb: Secondary | ICD-10-CM | POA: Diagnosis not present

## 2019-03-12 DIAGNOSIS — R7303 Prediabetes: Secondary | ICD-10-CM

## 2019-03-12 NOTE — Patient Instructions (Addendum)
To go to pharmacy to get your TDaP and Shingles vaccine  Continue to work on dietary modifications   Heart-Healthy Eating Plan Many factors influence your heart (coronary) health, including eating and exercise habits. Coronary risk increases with abnormal blood fat (lipid) levels. Heart-healthy meal planning includes limiting unhealthy fats, increasing healthy fats, and making other diet and lifestyle changes. . What are tips for following this plan? Cooking Cook foods using methods other than frying. Baking, boiling, grilling, and broiling are all good options. Other ways to reduce fat include:  Removing the skin from poultry.  Removing all visible fats from meats.  Steaming vegetables in water or broth. Meal planning   At meals, imagine dividing your plate into fourths: ? Fill one-half of your plate with vegetables and green salads. ? Fill one-fourth of your plate with whole grains. ? Fill one-fourth of your plate with lean protein foods.  Eat 4-5 servings of vegetables per day. One serving equals 1 cup raw or cooked vegetable, or 2 cups raw leafy greens.  Eat 4-5 servings of fruit per day. One serving equals 1 medium whole fruit,  cup dried fruit,  cup fresh, frozen, or canned fruit, or  cup 100% fruit juice.  Eat more foods that contain soluble fiber. Examples include apples, broccoli, carrots, beans, peas, and barley. Aim to get 25-30 g of fiber per day.  Increase your consumption of legumes, nuts, and seeds to 4-5 servings per week. One serving of dried beans or legumes equals  cup cooked, 1 serving of nuts is  cup, and 1 serving of seeds equals 1 tablespoon. Fats  Choose healthy fats more often. Choose monounsaturated and polyunsaturated fats, such as olive and canola oils, flaxseeds, walnuts, almonds, and seeds.  Eat more omega-3 fats. Choose salmon, mackerel, sardines, tuna, flaxseed oil, and ground flaxseeds. Aim to eat fish at least 2 times each week.  Check food  labels carefully to identify foods with trans fats or high amounts of saturated fat.  Limit saturated fats. These are found in animal products, such as meats, butter, and cream. Plant sources of saturated fats include palm oil, palm kernel oil, and coconut oil.  Avoid foods with partially hydrogenated oils in them. These contain trans fats. Examples are stick margarine, some tub margarines, cookies, crackers, and other baked goods.  Avoid fried foods. General information  Eat more home-cooked food and less restaurant, buffet, and fast food.  Limit or avoid alcohol.  Limit foods that are high in starch and sugar.  Lose weight if you are overweight. Losing just 5-10% of your body weight can help your overall health and prevent diseases such as diabetes and heart disease.  Monitor your salt (sodium) intake, especially if you have high blood pressure. Talk with your health care provider about your sodium intake.  Try to incorporate more vegetarian meals weekly. What foods can I eat? Fruits All fresh, canned (in natural juice), or frozen fruits. Vegetables Fresh or frozen vegetables (raw, steamed, roasted, or grilled). Green salads. Grains Most grains. Choose whole wheat and whole grains most of the time. Rice and pasta, including brown rice and pastas made with whole wheat. Meats and other proteins Lean, well-trimmed beef, veal, pork, and lamb. Chicken and [place] without skin. All fish and shellfish. Wild duck, rabbit, pheasant, and venison. Egg whites or low-cholesterol egg substitutes. Dried beans, peas, lentils, and tofu. Seeds and most nuts. Dairy Low-fat or nonfat cheeses, including ricotta and mozzarella. Skim or 1% milk (liquid, powdered, or evaporated).  Buttermilk made with low-fat milk. Nonfat or low-fat yogurt. Fats and oils Non-hydrogenated (trans-free) margarines. Vegetable oils, including soybean, sesame, sunflower, olive, peanut, safflower, corn, canola, and cottonseed.  Salad dressings or mayonnaise made with a vegetable oil. Beverages Water (mineral or sparkling). Coffee and tea. Diet carbonated beverages. Sweets and desserts Sherbet, gelatin, and fruit ice. Small amounts of dark chocolate. Limit all sweets and desserts. Seasonings and condiments All seasonings and condiments. The items listed above may not be a complete list of foods and beverages you can eat. Contact a dietitian for more options. What foods are not recommended? Fruits Canned fruit in heavy syrup. Fruit in cream or butter sauce. Fried fruit. Limit coconut. Vegetables Vegetables cooked in cheese, cream, or butter sauce. Fried vegetables. Grains Breads made with saturated or trans fats, oils, or whole milk. Croissants. Sweet rolls. Donuts. High-fat crackers, such as cheese crackers. Meats and other proteins Fatty meats, such as hot dogs, ribs, sausage, bacon, rib-eye roast or steak. High-fat deli meats, such as salami and bologna. Caviar. Domestic duck and goose. Organ meats, such as liver. Dairy Cream, sour cream, cream cheese, and creamed cottage cheese. Whole milk cheeses. Whole or 2% milk (liquid, evaporated, or condensed). Whole buttermilk. Cream sauce or high-fat cheese sauce. Whole-milk yogurt. Fats and oils Meat fat, or shortening. Cocoa butter, hydrogenated oils, palm oil, coconut oil, palm kernel oil. Solid fats and shortenings, including bacon fat, salt pork, lard, and butter. Nondairy cream substitutes. Salad dressings with cheese or sour cream. Beverages Regular sodas and any drinks with added sugar. Sweets and desserts Frosting. Pudding. Cookies. Cakes. Pies. Milk chocolate or white chocolate. Buttered syrups. Full-fat ice cream or ice cream drinks. The items listed above may not be a complete list of foods and beverages to avoid. Contact a dietitian for more information. Summary  Heart-healthy meal planning includes limiting unhealthy fats, increasing healthy fats, and  making other diet and lifestyle changes.  Lose weight if you are overweight. Losing just 5-10% of your body weight can help your overall health and prevent diseases such as diabetes and heart disease.  Focus on eating a balance of foods, including fruits and vegetables, low-fat or nonfat dairy, lean protein, nuts and legumes, whole grains, and heart-healthy oils and fats. This information is not intended to replace advice given to you by your health care provider. Make sure you discuss any questions you have with your health care provider. Document Released: 02/13/2008 Document Revised: 06/13/2017 Document Reviewed: 06/13/2017 Elsevier Patient Education  2020 [employer].

## 2019-03-12 NOTE — Progress Notes (Signed)
Careteam: Patient Care Team: Lauree Chandler, NP as PCP - General (Geriatric Medicine)  Advanced Directive information Does Patient Have a Medical Advance Directive?: No, Would patient like information on creating a medical advance directive?: No - Patient declined  No Known Allergies  Chief Complaint  Patient presents with  . Medical Management of Chronic Issues    4 month follow-up   . Immunizations    Discuss need for TDaP/TD   . Quality Metric Gaps    Discuss need for colonoscopy      HPI: Patient is a 68 y.o. male seen in the office today for 4 month follow up.   HTN - BP controlled. on amlodipine.   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  Hyperlipidemia - no myalgias. Takes lipitor 61m. LDL at 82. GOAL <70.   Tobacco abuse - quit smoking several yrs ago.He no longer chews tobacco  CKD -stage 3. Cr 1.38, drinking 2.5 bottles of water a day. Attempting to increase   Prediabetes - fasting glucose 93   Anemia - Hgb 13.8. stable  Seasonal allergies- stable, using zyrtec  tdap has already been sent to pharmacy in the past- did not get this when he got flu  Review of Systems:  Review of Systems  Constitutional: Negative for chills, fever and weight loss.  HENT: Negative for tinnitus.   Respiratory: Negative for cough, sputum production and shortness of breath.   Cardiovascular: Negative for chest pain, palpitations and leg swelling.  Gastrointestinal: Negative for abdominal pain, constipation, diarrhea and heartburn.  Genitourinary: Negative for dysuria, frequency and urgency.  Musculoskeletal: Negative for back pain, falls, joint pain and myalgias.  Skin: Negative.   Neurological: Positive for tingling (right arm). Negative for dizziness and  headaches.  Psychiatric/Behavioral: Negative for depression and memory loss. The patient does not have insomnia.    Past Medical History:  Diagnosis Date  . Allergy   . Arthritis    hand  . Chronic kidney disease    kidney stone "long time ago"  . Hyperlipidemia   . Hypertension   . Stroke ([facility] 2019   Sep 22, 2017   Past Surgical History:  Procedure Laterality Date  . sarcidosis surgery- 1984     pt states he isn't sure what was involved with this, possible lymph nodes removed   Social History:   reports that he has quit smoking. He has quit using smokeless tobacco.  His smokeless tobacco use included chew. He reports previous alcohol use. He reports previous drug use.  Family History  Problem Relation Age of Onset  . Pancreatic cancer Mother   . Cancer Neg Hx   . Hypertension Neg Hx   . Heart disease Neg Hx   . Colon cancer Neg Hx   . Esophageal cancer Neg Hx   . Rectal cancer Neg Hx   . Stomach cancer Neg Hx     Medications: Patient's Medications  New Prescriptions   No medications on file  Previous Medications   AMLODIPINE (NORVASC) 10 MG TABLET    Take 1 tablet (10 mg total) by mouth daily.   ASPIRIN 325 MG EC TABLET    Take 325 mg by mouth daily.   ATORVASTATIN (LIPITOR) 40 MG TABLET    Take 1 tablet (40 mg total) by mouth daily.  CETIRIZINE HCL (ZYRTEC PO)    Take by mouth.   COENZYME Q10 100 MG CAPSULE    Take 2 capsules (200 mg total) by mouth daily.   GABAPENTIN (NEURONTIN) 300 MG CAPSULE    Take 1 capsule (300 mg total) by mouth at bedtime.   NA SULFATE-K SULFATE-MG SULF (SUPREP BOWEL PREP KIT) 17.5-3.13-1.6 GM/177ML SOLN    Suprep as directed, no substitutions  Modified Medications   No medications on file  Discontinued Medications   No medications on file    Physical Exam:  Vitals:   03/12/19 1517  BP: 124/80  Pulse: 81  Temp: 97.8 F (36.6 C)  TempSrc: Temporal  SpO2: 98%  Weight: 159 lb 3.2 oz (72.2 kg)  Height: _0  (1.727 m)   Body  mass index is 24.21 kg/m. Wt Readings from Last 3 Encounters:  03/12/19 159 lb 3.2 oz (72.2 kg)  09/30/18 156 lb 3.2 oz (70.9 kg)  07/27/18 160 lb (72.6 kg)    Physical Exam Constitutional:      General: He is not in acute distress.    Appearance: He is well-developed. He is not diaphoretic.  HENT:     Head: Normocephalic and atraumatic.  Eyes:     Conjunctiva/sclera: Conjunctivae normal.     Pupils: Pupils are equal, round, and reactive to light.  Neck:     Musculoskeletal: Normal range of motion and neck supple.  Cardiovascular:     Rate and Rhythm: Normal rate and regular rhythm.     Heart sounds: Murmur present.  Pulmonary:     Effort: Pulmonary effort is normal.     Breath sounds: Normal breath sounds.  Abdominal:     General: Bowel sounds are normal.     Palpations: Abdomen is soft.  Musculoskeletal:        General: No tenderness.  Skin:    General: Skin is warm and dry.  Neurological:     Mental Status: He is alert and oriented to person, place, and time.     Cranial Nerves: Dysarthria present.     Labs reviewed: Basic Metabolic Panel: Recent Labs    05/07/18 0803 09/25/18 0802 03/08/19 0827  NA 142 140 143  K 4.1 4.2 5.1  CL 106 102 106  CO2 30 32 21  GLUCOSE 93 85 93  BUN _1 CREATININE 1.33* 1.53* 1.38*  CALCIUM 9.1 9.7 9.9   Liver Function Tests: Recent Labs    05/07/18 0803 09/25/18 0802 03/08/19 0827  AST _2 ALT _3 BILITOT 0.4 0.9 0.5  PROT 6.4 7.1 7.2   No results for input(s): LIPASE, AMYLASE in the last 8760 hours. No results for input(s): AMMONIA in the last 8760 hours. CBC: Recent Labs    09/25/18 0802 03/08/19 0827  WBC 6.9 5.6  NEUTROABS 2,912 2,302  HGB 14.1 13.8  HCT 42.4 44.3  MCV 79.8* 83.0  PLT 207 195   Lipid Panel: Recent Labs    05/07/18 0803 09/25/18 0802 03/08/19 0827  CHOL 128 174 162  HDL 53 58 59  LDLCALC 59 94 82  TRIG 81 119 113  CHOLHDL 2.4 3.0 2.7   TSH: No results for  input(s): TSH in the last 8760 hours. A1C: Lab Results  Component Value Date   HGBA1C 5.9 (H) 09/25/2018     Assessment/Plan 1. Essential hypertension -controlled on current regimen, encouraged to continue diet modifications.  - CMP with eGFR([facility]); Future - CBC with Differential/Platelet;  Future  2. Mixed hyperlipidemia -LDL 82, goal <70, continues on lipitor 40 mg daily, encouraged to make additional dietary modifications and information provided.  - CMP with eGFR([facility]); Future - Lipid Panel; Future  3. Neuropathy, upper extremity, right -controlled on gabapentin 300 mg by mouth at bedtime. - CBC with Differential/Platelet; Future  4. 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 Continues on ASA 368m with proper control of blood pressure, lipids and blood sugar.   5. Prediabetes -blood sugar at goal, continues to work on dietary modifications.  - Hemoglobin A1c; Future  6. Preventative -cologuard paperwork completed as he prefers to do this. -instructed to go to pharmacy for tdap and shingles vaccine  Next appt: 6 months with lab prior  Djuana Littleton K. EEldorado AP[facility]mouth[facility]ult Medicine [phone]

## 2019-04-03 ENCOUNTER — Encounter: Payer: Self-pay | Admitting: Nurse Practitioner

## 2019-04-04 ENCOUNTER — Other Ambulatory Visit: Payer: Self-pay | Admitting: Nurse Practitioner

## 2019-04-04 DIAGNOSIS — G5691 Unspecified mononeuropathy of right upper limb: Secondary | ICD-10-CM

## 2019-04-05 ENCOUNTER — Other Ambulatory Visit: Payer: Self-pay

## 2019-04-05 ENCOUNTER — Encounter: Payer: Self-pay | Admitting: Nurse Practitioner

## 2019-04-05 DIAGNOSIS — G5691 Unspecified mononeuropathy of right upper limb: Secondary | ICD-10-CM

## 2019-04-05 MED ORDER — GABAPENTIN 300 MG PO CAPS: ORAL_CAPSULE | ORAL | 0 refills | Status: DC

## 2019-04-05 NOTE — Telephone Encounter (Signed)
This encounter was created in error - please disregard.

## 2019-04-10 ENCOUNTER — Encounter: Payer: Self-pay | Admitting: Nurse Practitioner

## 2019-05-17 ENCOUNTER — Other Ambulatory Visit: Payer: Self-pay

## 2019-05-17 ENCOUNTER — Ambulatory Visit (INDEPENDENT_AMBULATORY_CARE_PROVIDER_SITE_OTHER): Payer: Medicare PPO | Admitting: Nurse Practitioner

## 2019-05-17 ENCOUNTER — Encounter: Payer: Self-pay | Admitting: Nurse Practitioner

## 2019-05-17 DIAGNOSIS — Z0000 Encounter for general adult medical examination without abnormal findings: Secondary | ICD-10-CM

## 2019-05-17 NOTE — Patient Instructions (Signed)
Randall James , Thank you for taking time to come for your Medicare Wellness Visit. I appreciate your ongoing commitment to your health goals. Please review the following plan we discussed and let me know if I can assist you in the future.   Screening recommendations/referrals: Colonoscopy_ do not forget to complete cologuard Recommended yearly ophthalmology/optometry visit for glaucoma screening and checkup Recommended yearly dental visit for hygiene and checkup  Vaccinations: Influenza vaccine up to date Pneumococcal vaccine to get pneumococcal 23 next time you are in office  Tdap vaccine up to date Shingles vaccine -DONT FORGET 2nd dose    Advanced directives: to complete and bring back to office so we can place on file   Conditions/risks identified: cardiovascular risk due to hx of stroke  Next appointment: 1 year  Preventive Care 70 Years and Older, Male Preventive care refers to lifestyle choices and visits with your health care provider that can promote health and wellness. What does preventive care include?  A yearly physical exam. This is also called an annual well check.  Dental exams once or twice a year.  Routine eye exams. Ask your health care provider how often you should have your eyes checked.  Personal lifestyle choices, including:  Daily care of your teeth and gums.  Regular physical activity.  Eating a healthy diet.  Avoiding tobacco and drug use.  Limiting alcohol use.  Practicing safe sex.  Taking low doses of aspirin every day.  Taking vitamin and mineral supplements as recommended by your health care provider. What happens during an annual well check? The services and screenings done by your health care provider during your annual well check will depend on your age, overall health, lifestyle risk factors, and family history of disease. Counseling  Your health care provider may ask you questions about your:  Alcohol use.  Tobacco use.  Drug  use.  Emotional well-being.  Home and relationship well-being.  Sexual activity.  Eating habits.  History of falls.  Memory and ability to understand (cognition).  Work and work Statistician. Screening  You may have the following tests or measurements:  Height, weight, and BMI.  Blood pressure.  Lipid and cholesterol levels. These may be checked every 5 years, or more frequently if you are over 9 years old.  Skin check.  Lung cancer screening. You may have this screening every year starting at age 31 if you have a 30-pack-year history of smoking and currently smoke or have quit within the past 15 years.  Fecal occult blood test (FOBT) of the stool. You may have this test every year starting at age 62.  Flexible sigmoidoscopy or colonoscopy. You may have a sigmoidoscopy every 5 years or a colonoscopy every 10 years starting at age 41.  Prostate cancer screening. Recommendations will vary depending on your family history and other risks.  Hepatitis C blood test.  Hepatitis B blood test.  Sexually transmitted disease (STD) testing.  Diabetes screening. This is done by checking your blood sugar (glucose) after you have not eaten for a while (fasting). You may have this done every 1-3 years.  Abdominal aortic aneurysm (AAA) screening. You may need this if you are a current or former smoker.  Osteoporosis. You may be screened starting at age 20 if you are at high risk. Talk with your health care provider about your test results, treatment options, and if necessary, the need for more tests. Vaccines  Your health care provider may recommend certain vaccines, such as:  Influenza  vaccine. This is recommended every year.  Tetanus, diphtheria, and acellular pertussis (Tdap, Td) vaccine. You may need a Td booster every 10 years.  Zoster vaccine. You may need this after age 16.  Pneumococcal 13-valent conjugate (PCV13) vaccine. One dose is recommended after age  36.  Pneumococcal polysaccharide (PPSV23) vaccine. One dose is recommended after age 46. Talk to your health care provider about which screenings and vaccines you need and how often you need them. This information is not intended to replace advice given to you by your health care provider. Make sure you discuss any questions you have with your health care provider. Document Released: 06/02/2015 Document Revised: 01/24/2016 Document Reviewed: 03/07/2015 Elsevier Interactive Patient Education  2017 [place] Prevention in the Home Falls can cause injuries. They can happen to people of all ages. There are many things you can do to make your home safe and to help prevent falls. What can I do on the outside of my home?  Regularly fix the edges of walkways and driveways and fix any cracks.  Remove anything that might make you trip as you walk through a door, such as a raised step or threshold.  Trim any bushes or trees on the path to your home.  Use bright outdoor lighting.  Clear any walking paths of anything that might make someone trip, such as rocks or tools.  Regularly check to see if handrails are loose or broken. Make sure that both sides of any steps have handrails.  Any raised decks and porches should have guardrails on the edges.  Have any leaves, snow, or ice cleared regularly.  Use sand or salt on walking paths during winter.  Clean up any spills in your garage right away. This includes oil or grease spills. What can I do in the bathroom?  Use night lights.  Install grab bars by the toilet and in the tub and shower. Do not use towel bars as grab bars.  Use non-skid mats or decals in the tub or shower.  If you need to sit down in the shower, use a plastic, non-slip stool.  Keep the floor dry. Clean up any water that spills on the floor as soon as it happens.  Remove soap buildup in the tub or shower regularly.  Attach bath mats securely with double-sided  non-slip rug tape.  Do not have throw rugs and other things on the floor that can make you trip. What can I do in the bedroom?  Use night lights.  Make sure that you have a light by your bed that is easy to reach.  Do not use any sheets or blankets that are too big for your bed. They should not hang down onto the floor.  Have a firm chair that has side arms. You can use this for support while you get dressed.  Do not have throw rugs and other things on the floor that can make you trip. What can I do in the kitchen?  Clean up any spills right away.  Avoid walking on wet floors.  Keep items that you use a lot in easy-to-reach places.  If you need to reach something above you, use a strong step stool that has a grab bar.  Keep electrical cords out of the way.  Do not use floor polish or wax that makes floors slippery. If you must use wax, use non-skid floor wax.  Do not have throw rugs and other things on the floor that can  make you trip. What can I do with my stairs?  Do not leave any items on the stairs.  Make sure that there are handrails on both sides of the stairs and use them. Fix handrails that are broken or loose. Make sure that handrails are as long as the stairways.  Check any carpeting to make sure that it is firmly attached to the stairs. Fix any carpet that is loose or worn.  Avoid having throw rugs at the top or bottom of the stairs. If you do have throw rugs, attach them to the floor with carpet tape.  Make sure that you have a light switch at the top of the stairs and the bottom of the stairs. If you do not have them, ask someone to add them for you. What else can I do to help prevent falls?  Wear shoes that:  Do not have high heels.  Have rubber bottoms.  Are comfortable and fit you well.  Are closed at the toe. Do not wear sandals.  If you use a stepladder:  Make sure that it is fully opened. Do not climb a closed stepladder.  Make sure that both  sides of the stepladder are locked into place.  Ask someone to hold it for you, if possible.  Clearly mark and make sure that you can see:  Any grab bars or handrails.  First and last steps.  Where the edge of each step is.  Use tools that help you move around (mobility aids) if they are needed. These include:  Canes.  Walkers.  Scooters.  Crutches.  Turn on the lights when you go into a dark area. Replace any light bulbs as soon as they burn out.  Set up your furniture so you have a clear path. Avoid moving your furniture around.  If any of your floors are uneven, fix them.  If there are any pets around you, be aware of where they are.  Review your medicines with your doctor. Some medicines can make you feel dizzy. This can increase your chance of falling. Ask your doctor what other things that you can do to help prevent falls. This information is not intended to replace advice given to you by your health care provider. Make sure you discuss any questions you have with your health care provider. Document Released: 03/02/2009 Document Revised: 10/12/2015 Document Reviewed: 06/10/2014 Elsevier Interactive Patient Education  2017 [employer].

## 2019-05-17 NOTE — Progress Notes (Signed)
Subjective:   Randall James is a 68 y.o. male who presents for Medicare Annual/Subsequent preventive examination.  Review of Systems:   Cardiac Risk Factors include: male gender;dyslipidemia;hypertension;advanced age (>82mn, >>71women)     Objective:    Vitals: There were no vitals taken for this visit.  There is no height or weight on file to calculate BMI.  Advanced Directives 05/17/2019 03/12/2019 09/30/2018 05/11/2018 01/28/2018 10/24/2017  Does Patient Have a Medical Advance Directive? No No Yes No No No  Does patient want to make changes to medical advance directive? - - Yes (MAU/Ambulatory/Procedural Areas - Information given) - - -  Would patient like information on creating a medical advance directive? Yes (MAU/Ambulatory/Procedural Areas - Information given) No - Patient declined - Yes (MAU/Ambulatory/Procedural Areas - Information given) - Yes (MAU/Ambulatory/Procedural Areas - Information given)    Tobacco Social History   Tobacco Use  Smoking Status Former Smoker  Smokeless Tobacco Former USystems developer . Types: Chew  Tobacco Comment   no longer using chew since CVA in 2019     Counseling given: Not Answered Comment: no longer using chew since CVA in 2019   Clinical Intake:  Pre-visit preparation completed: Yes  Pain : No/denies pain     BMI - recorded: 24.21 Nutritional Status: BMI of 19-24  Normal Diabetes: No  How often do you need to have someone help you when you read instructions, pamphlets, or other written materials from your doctor or pharmacy?: 3 - Sometimes What is the last grade level you completed in school?: some college  Interpreter Needed?: No     Past Medical History:  Diagnosis Date  . Allergy   . Arthritis    hand  . Chronic kidney disease    kidney stone "long time ago"  . Hyperlipidemia   . Hypertension   . Stroke ([facility] 2019   Sep 22, 2017   Past Surgical History:  Procedure Laterality Date  . sarcidosis surgery- 1984       pt states he isn't sure what was involved with this, possible lymph nodes removed   Family History  Problem Relation Age of Onset  . Pancreatic cancer Mother   . Cancer Neg Hx   . Hypertension Neg Hx   . Heart disease Neg Hx   . Colon cancer Neg Hx   . Esophageal cancer Neg Hx   . Rectal cancer Neg Hx   . Stomach cancer Neg Hx    Social History   Socioeconomic History  . Marital status: Married    Spouse name: Not on file  . Number of children: Not on file  . Years of education: Not on file  . Highest education level: Not on file  Occupational History  . Not on file  Tobacco Use  . Smoking status: Former SResearch scientist (life sciences) . Smokeless tobacco: Former USystems developer   Types: Chew  . Tobacco comment: no longer using chew since CVA in 2019  Substance and Sexual Activity  . Alcohol use: Not Currently  . Drug use: Not Currently    Comment: "in college"  . Sexual activity: Not Currently  Other Topics Concern  . Not on file  Social History Narrative   Social History      Diet? none      Do you drink/eat things with caffeine? Coffee- yes      Marital status?              married  What year were you married? 08/08/1973      Do you live in a house, apartment, assisted living, condo, trailer, etc.? house      Is it one or more stories? 2 stories      How many persons live in your home? 2      Do you have any pets in your home? (please list) none      Highest level of education completed? BS in Education and Social Studies      Current or past profession: school teacher      Do you exercise?         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 no      Do you have a DNR form?                                  If not, do you want to discuss one? no      Do you have signed POA/HPOA for forms? no      Functional Status      Do  you have difficulty bathing or dressing yourself? yes      Do you have difficulty preparing food or eating? yes      Do you have difficulty managing your medications? yes      Do you have difficulty managing your finances? no      Do you have difficulty affording your medications? No    Social Determinants of Health   Financial Resource Strain:   . Difficulty of Paying Living Expenses: Not on file  Food Insecurity:   . Worried About Charity fundraiser in the Last Year: Not on file  . Ran Out of Food in the Last Year: Not on file  Transportation Needs:   . Lack of Transportation (Medical): Not on file  . Lack of Transportation (Non-Medical): Not on file  Physical Activity:   . Days of Exercise per Week: Not on file  . Minutes of Exercise per Session: Not on file  Stress:   . Feeling of Stress : Not on file  Social Connections:   . Frequency of Communication with Friends and Family: Not on file  . Frequency of Social Gatherings with Friends and Family: Not on file  . Attends Religious Services: Not on file  . Active Member of Clubs or Organizations: Not on file  . Attends Archivist Meetings: Not on file  . Marital Status: Not on file    Outpatient Encounter Medications as of 05/17/2019  Medication Sig  . amLODipine (NORVASC) 10 MG tablet Take 1 tablet (10 mg total) by mouth daily.  Marland Kitchen aspirin 325 MG EC tablet Take 325 mg by mouth daily.  Marland Kitchen atorvastatin (LIPITOR) 40 MG tablet Take 1 tablet (40 mg total) by mouth daily.  . Cetirizine HCl (ZYRTEC PO) Take by mouth.  . Coenzyme Q10 100 MG capsule Take 2 capsules (200 mg total) by mouth daily.  Marland Kitchen gabapentin (NEURONTIN) 300 MG capsule TAKE 1 CAPSULE(300 MG) BY MOUTH AT BEDTIME  . Na Sulfate-K Sulfate-Mg Sulf (SUPREP BOWEL PREP KIT) 17.5-3.13-1.6 GM/177ML SOLN  Suprep as directed, no substitutions (Patient not taking: Reported on 03/12/2019)   No facility-administered encounter medications on file as of 05/17/2019.     Activities of Daily Living In your present state of health, do you have any difficulty performing the following activities: 05/17/2019  Hearing? N  Vision? N  Difficulty concentrating or making decisions? N  Walking or climbing stairs? N  Dressing or bathing? N  Doing errands, shopping? N  Preparing Food and eating ? N  Using the Toilet? N  In the past six months, have you accidently leaked urine? N  Do you have problems with loss of bowel control? N  Managing your Medications? N  Managing your Finances? N  Housekeeping or managing your Housekeeping? N  Some recent data might be hidden    Patient Care Team: Lauree Chandler, NP as PCP - General (Geriatric Medicine)   Assessment:   This is a routine wellness examination for [place].  Exercise Activities and Dietary recommendations Current Exercise Habits: Home exercise routine, Type of exercise: strength training/weights;stretching, Time (Minutes): 25, Frequency (Times/Week): 4, Weekly Exercise (Minutes/Week): 100  Goals    . DIET - EAT MORE FRUITS AND VEGETABLES    . DIET - INCREASE WATER INTAKE     Would like to drink 3 - 16 oz bottle of water a day        Fall Risk Fall Risk  05/17/2019 09/30/2018 05/11/2018 04/01/2018 01/28/2018  Falls in the past year? 0 0 0 0 No  Number falls in past yr: 0 0 0 - -  Injury with Fall? 0 0 0 - -   Is the patient's home free of loose throw rugs in walkways, pet beds, electrical cords, etc?   yes      Grab bars in the bathroom? no      Handrails on the stairs?   yes      Adequate lighting?   yes  Timed Get Up and Go Performed: na  Depression Screen PHQ 2/9 Scores 05/17/2019 09/30/2018 05/11/2018 10/24/2017  PHQ - 2 Score 0 0 0 0    Cognitive Function MMSE - Mini Mental State Exam 05/11/2018  Orientation to time 5  Orientation to Place 3  Registration 3  Attention/ Calculation 5  Recall 2  Language- name 2 objects 2  Language- repeat 1  Language- follow 3 step command 3   Language- read & follow direction 1  Write a sentence 1  Copy design 0  Total score 26     6CIT Screen 05/17/2019  What Year? 0 points  What month? 0 points  What time? 0 points  Count back from 20 0 points  Months in reverse 0 points  Repeat phrase 0 points  Total Score 0    Immunization History  Administered Date(s) Administered  . Influenza,inj,Quad PF,6+ Mos 01/28/2018, 02/25/2019  . Influenza-Unspecified 05/20/2017  . Pneumococcal Conjugate-13 05/11/2018  . Tdap 04/10/2019  . Zoster Recombinat (Shingrix) 04/10/2019    Qualifies for Shingles Vaccine? Yes   Screening Tests Health Maintenance  Topic Date Due  . COLONOSCOPY  04/01/1999  . PNA vac Low Risk Adult (2 of 2 - PPSV23) 05/12/2019  . TETANUS/TDAP  04/09/2029  . INFLUENZA VACCINE  Completed  . Hepatitis C Screening  Discontinued   Cancer Screenings: Lung: Low Dose CT Chest recommended if Age 17-80 years, 30 pack-year currently smoking OR have quit w/in 15years. Patient does not qualify. Colorectal: needs to complete cologard  Additional Screenings:  Hepatitis C Screening:  declined      Plan:     I have personally reviewed and noted the following in the patient's chart:   . Medical and social history . Use of alcohol, tobacco or illicit drugs  . Current medications and supplements . Functional ability and status . Nutritional status . Physical activity . Advanced directives . List of other physicians . Hospitalizations, surgeries, and ER visits in previous 12 months . Vitals . Screenings to include cognitive, depression, and falls . Referrals and appointments  In addition, I have reviewed and discussed with patient certain preventive protocols, quality metrics, and best practice recommendations. A written personalized care plan for preventive services as well as general preventive health recommendations were provided to patient.     Lauree Chandler, NP  05/17/2019

## 2019-05-17 NOTE — Progress Notes (Signed)
   This service is provided via telemedicine  No vital signs collected/recorded due to the encounter was a telemedicine visit.   Location of patient (ex: home, work):  Home  Patient consents to a telephone visit:  Yes  Location of the provider (ex: office, home):  [facility], Office  Name of any referring provider:  N/A  Names of all persons participating in the telemedicine service and their role in the encounter: S.Chrae B/CMA, Sherrie Mustache, NP, April (daughter)and Patient   Time spent on call: 9 min with medical assistant

## 2019-06-10 ENCOUNTER — Telehealth: Payer: Self-pay

## 2019-06-10 NOTE — Telephone Encounter (Signed)
Spoke to patient's daughter April. She stated patient was not able to take my call. Inquired about patient's cologuard kit mailed on 03/12/2019. Daughter states that she is aware of both her dad and moms kit not being done. They are having problems understanding the instructions. Both patient and wife are scheduled to visit daughter next month and bring kits with them. She stated she will be helping them get cologuard kits complete and sent in.

## 2019-07-02 ENCOUNTER — Encounter: Payer: Self-pay | Admitting: Nurse Practitioner

## 2019-07-02 DIAGNOSIS — G5691 Unspecified mononeuropathy of right upper limb: Secondary | ICD-10-CM

## 2019-07-02 DIAGNOSIS — I10 Essential (primary) hypertension: Secondary | ICD-10-CM

## 2019-07-02 DIAGNOSIS — E782 Mixed hyperlipidemia: Secondary | ICD-10-CM

## 2019-07-05 MED ORDER — GABAPENTIN 300 MG PO CAPS: ORAL_CAPSULE | ORAL | 0 refills | Status: DC

## 2019-07-05 MED ORDER — AMLODIPINE BESYLATE 10 MG PO TABS: 10.0000 mg | ORAL_TABLET | Freq: Every day | ORAL | 0 refills | Status: DC

## 2019-07-05 MED ORDER — ATORVASTATIN CALCIUM 40 MG PO TABS: 40.0000 mg | ORAL_TABLET | Freq: Every day | ORAL | 0 refills | Status: DC

## 2019-07-05 NOTE — Telephone Encounter (Signed)
After to attempts to send Gabapentin electronically, RX's will not go through and printed both times (RX's Shredded).   Gabapentin pended and sent to Sharon Seller, NP to approve

## 2019-07-05 NOTE — Addendum Note (Signed)
Addended by: Maurice Small on: 07/05/2019 10:41 AM   Modules accepted: Orders

## 2019-09-09 ENCOUNTER — Other Ambulatory Visit: Payer: Self-pay

## 2019-09-09 ENCOUNTER — Other Ambulatory Visit: Payer: Medicare PPO

## 2019-09-09 DIAGNOSIS — G5691 Unspecified mononeuropathy of right upper limb: Secondary | ICD-10-CM

## 2019-09-09 DIAGNOSIS — I10 Essential (primary) hypertension: Secondary | ICD-10-CM

## 2019-09-09 DIAGNOSIS — R7303 Prediabetes: Secondary | ICD-10-CM

## 2019-09-09 DIAGNOSIS — E782 Mixed hyperlipidemia: Secondary | ICD-10-CM

## 2019-09-10 ENCOUNTER — Ambulatory Visit: Payer: Medicare PPO | Admitting: Nurse Practitioner

## 2019-09-10 ENCOUNTER — Encounter: Payer: Self-pay | Admitting: Nurse Practitioner

## 2019-09-10 LAB — COMPLETE METABOLIC PANEL WITH GFR
AG Ratio: 1.3 (calc) (ref 1.0–2.5)
ALT: 16 U/L (ref 9–46)
AST: 19 U/L (ref 10–35)
Albumin: 4.2 g/dL (ref 3.6–5.1)
Alkaline phosphatase (APISO): 92 U/L (ref 35–144)
BUN/Creatinine Ratio: 7 (calc) (ref 6–22)
BUN: 10 mg/dL (ref 7–25)
CO2: 29 mmol/L (ref 20–32)
Calcium: 9.9 mg/dL (ref 8.6–10.3)
Chloride: 105 mmol/L (ref 98–110)
Creat: 1.46 mg/dL — ABNORMAL HIGH (ref 0.70–1.18)
GFR, Est African American: 56 mL/min/{1.73_m2} — ABNORMAL LOW (ref >=60)
GFR, Est Non African American: 48 mL/min/{1.73_m2} — ABNORMAL LOW (ref >=60)
Globulin: 3.2 g/dL (calc) (ref 1.9–3.7)
Glucose, Bld: 96 mg/dL (ref 65–99)
Potassium: 5.2 mmol/L (ref 3.5–5.3)
Sodium: 142 mmol/L (ref 135–146)
Total Bilirubin: 0.9 mg/dL (ref 0.2–1.2)
Total Protein: 7.4 g/dL (ref 6.1–8.1)

## 2019-09-10 LAB — CBC WITH DIFFERENTIAL/PLATELET
Absolute Monocytes: 690 cells/uL (ref 200–950)
Basophils Absolute: 29 cells/uL (ref 0–200)
Basophils Relative: 0.5 %
Eosinophils Absolute: 148 cells/uL (ref 15–500)
Eosinophils Relative: 2.6 %
HCT: 42.1 % (ref 38.5–50.0)
Hemoglobin: 13.5 g/dL (ref 13.2–17.1)
Lymphs Abs: 2234 cells/uL (ref 850–3900)
MCH: 25.8 pg — ABNORMAL LOW (ref 27.0–33.0)
MCHC: 32.1 g/dL (ref 32.0–36.0)
MCV: 80.5 fL (ref 80.0–100.0)
MPV: 11.2 fL (ref 7.5–12.5)
Monocytes Relative: 12.1 %
Neutro Abs: 2599 cells/uL (ref 1500–7800)
Neutrophils Relative %: 45.6 %
Platelets: 237 10*3/uL (ref 140–400)
RBC: 5.23 10*6/uL (ref 4.20–5.80)
RDW: 13.5 % (ref 11.0–15.0)
Total Lymphocyte: 39.2 %
WBC: 5.7 10*3/uL (ref 3.8–10.8)

## 2019-09-10 LAB — HEMOGLOBIN A1C
Hgb A1c MFr Bld: 5.9 % of total Hgb — ABNORMAL HIGH (ref <5.7)
Mean Plasma Glucose: 123 (calc)
eAG (mmol/L): 6.8 (calc)

## 2019-09-10 LAB — LIPID PANEL
Cholesterol: 167 mg/dL (ref <200)
HDL: 55 mg/dL (ref >=40)
LDL Cholesterol (Calc): 90 mg/dL (calc)
Non-HDL Cholesterol (Calc): 112 mg/dL (calc) (ref <130)
Total CHOL/HDL Ratio: 3 (calc) (ref <5.0)
Triglycerides: 128 mg/dL (ref <150)

## 2019-09-10 LAB — COLOGUARD

## 2019-09-13 ENCOUNTER — Ambulatory Visit (INDEPENDENT_AMBULATORY_CARE_PROVIDER_SITE_OTHER): Payer: Medicare PPO | Admitting: Nurse Practitioner

## 2019-09-13 ENCOUNTER — Other Ambulatory Visit: Payer: Self-pay

## 2019-09-13 ENCOUNTER — Encounter: Payer: Self-pay | Admitting: Nurse Practitioner

## 2019-09-13 VITALS — BP 120/88 | HR 69 | Temp 98.0°F | Resp 16 | Ht 68.0 in | Wt 161.5 lb

## 2019-09-13 DIAGNOSIS — Z23 Encounter for immunization: Secondary | ICD-10-CM

## 2019-09-13 DIAGNOSIS — I10 Essential (primary) hypertension: Secondary | ICD-10-CM

## 2019-09-13 DIAGNOSIS — I69322 Dysarthria following cerebral infarction: Secondary | ICD-10-CM

## 2019-09-13 DIAGNOSIS — E782 Mixed hyperlipidemia: Secondary | ICD-10-CM | POA: Diagnosis not present

## 2019-09-13 DIAGNOSIS — R350 Frequency of micturition: Secondary | ICD-10-CM

## 2019-09-13 DIAGNOSIS — G5691 Unspecified mononeuropathy of right upper limb: Secondary | ICD-10-CM

## 2019-09-13 MED ORDER — ROSUVASTATIN CALCIUM 20 MG PO TABS: 20.0000 mg | ORAL_TABLET | Freq: Every day | ORAL | 3 refills | Status: DC

## 2019-09-13 NOTE — Patient Instructions (Addendum)
You can go ahead and get shingles vaccine   To stop lipitor and START CRESTOR 20 mg daily for cholesterol  Continue to work on heart healthy diet  Follow up fasting labs in 3 months  Follow up in office for routine follow up in 6 months    Heart-Healthy Eating Plan Many factors influence your heart (coronary) health, including eating and exercise habits. Coronary risk increases with abnormal blood fat (lipid) levels. Heart-healthy meal planning includes limiting unhealthy fats, increasing healthy fats, and making other diet and lifestyle changes.  What are tips for following this plan? Cooking Cook foods using methods other than frying. Baking, boiling, grilling, and broiling are all good options. Other ways to reduce fat include:  Removing the skin from poultry.  Removing all visible fats from meats.  Steaming vegetables in water or broth. Meal planning   At meals, imagine dividing your plate into fourths: ? Fill one-half of your plate with vegetables and green salads. ? Fill one-fourth of your plate with whole grains. ? Fill one-fourth of your plate with lean protein foods.  Eat 4-5 servings of vegetables per day. One serving equals 1 cup raw or cooked vegetable, or 2 cups raw leafy greens.  Eat 4-5 servings of fruit per day. One serving equals 1 medium whole fruit,  cup dried fruit,  cup fresh, frozen, or canned fruit, or  cup 100% fruit juice.  Eat more foods that contain soluble fiber. Examples include apples, broccoli, carrots, beans, peas, and barley. Aim to get 25-30 g of fiber per day.  Increase your consumption of legumes, nuts, and seeds to 4-5 servings per week. One serving of dried beans or legumes equals  cup cooked, 1 serving of nuts is  cup, and 1 serving of seeds equals 1 tablespoon. Fats  Choose healthy fats more often. Choose monounsaturated and polyunsaturated fats, such as olive and canola oils, flaxseeds, walnuts, almonds, and seeds.  Eat more  omega-3 fats. Choose salmon, mackerel, sardines, tuna, flaxseed oil, and ground flaxseeds. Aim to eat fish at least 2 times each week.  Check food labels carefully to identify foods with trans fats or high amounts of saturated fat.  Limit saturated fats. These are found in animal products, such as meats, butter, and cream. Plant sources of saturated fats include palm oil, palm kernel oil, and coconut oil.  Avoid foods with partially hydrogenated oils in them. These contain trans fats. Examples are stick margarine, some tub margarines, cookies, crackers, and other baked goods.  Avoid fried foods. General information  Eat more home-cooked food and less restaurant, buffet, and fast food.  Limit or avoid alcohol.  Limit foods that are high in starch and sugar.  Lose weight if you are overweight. Losing just 5-10% of your body weight can help your overall health and prevent diseases such as diabetes and heart disease.  Monitor your salt (sodium) intake, especially if you have high blood pressure. Talk with your health care provider about your sodium intake.  Try to incorporate more vegetarian meals weekly. What foods can I eat? Fruits All fresh, canned (in natural juice), or frozen fruits. Vegetables Fresh or frozen vegetables (raw, steamed, roasted, or grilled). Green salads. Grains Most grains. Choose whole wheat and whole grains most of the time. Rice and pasta, including brown rice and pastas made with whole wheat. Meats and other proteins Lean, well-trimmed beef, veal, pork, and lamb. Chicken and [place] without skin. All fish and shellfish. Wild duck, rabbit, pheasant, and venison. Egg  whites or low-cholesterol egg substitutes. Dried beans, peas, lentils, and tofu. Seeds and most nuts. Dairy Low-fat or nonfat cheeses, including ricotta and mozzarella. Skim or 1% milk (liquid, powdered, or evaporated). Buttermilk made with low-fat milk. Nonfat or low-fat yogurt. Fats and  oils Non-hydrogenated (trans-free) margarines. Vegetable oils, including soybean, sesame, sunflower, olive, peanut, safflower, corn, canola, and cottonseed. Salad dressings or mayonnaise made with a vegetable oil. Beverages Water (mineral or sparkling). Coffee and tea. Diet carbonated beverages. Sweets and desserts Sherbet, gelatin, and fruit ice. Small amounts of dark chocolate. Limit all sweets and desserts. Seasonings and condiments All seasonings and condiments. The items listed above may not be a complete list of foods and beverages you can eat. Contact a dietitian for more options. What foods are not recommended? Fruits Canned fruit in heavy syrup. Fruit in cream or butter sauce. Fried fruit. Limit coconut. Vegetables Vegetables cooked in cheese, cream, or butter sauce. Fried vegetables. Grains Breads made with saturated or trans fats, oils, or whole milk. Croissants. Sweet rolls. Donuts. High-fat crackers, such as cheese crackers. Meats and other proteins Fatty meats, such as hot dogs, ribs, sausage, bacon, rib-eye roast or steak. High-fat deli meats, such as salami and bologna. Caviar. Domestic duck and goose. Organ meats, such as liver. Dairy Cream, sour cream, cream cheese, and creamed cottage cheese. Whole milk cheeses. Whole or 2% milk (liquid, evaporated, or condensed). Whole buttermilk. Cream sauce or high-fat cheese sauce. Whole-milk yogurt. Fats and oils Meat fat, or shortening. Cocoa butter, hydrogenated oils, palm oil, coconut oil, palm kernel oil. Solid fats and shortenings, including bacon fat, salt pork, lard, and butter. Nondairy cream substitutes. Salad dressings with cheese or sour cream. Beverages Regular sodas and any drinks with added sugar. Sweets and desserts Frosting. Pudding. Cookies. Cakes. Pies. Milk chocolate or white chocolate. Buttered syrups. Full-fat ice cream or ice cream drinks. The items listed above may not be a complete list of foods and beverages  to avoid. Contact a dietitian for more information. Summary  Heart-healthy meal planning includes limiting unhealthy fats, increasing healthy fats, and making other diet and lifestyle changes.  Lose weight if you are overweight. Losing just 5-10% of your body weight can help your overall health and prevent diseases such as diabetes and heart disease.  Focus on eating a balance of foods, including fruits and vegetables, low-fat or nonfat dairy, lean protein, nuts and legumes, whole grains, and heart-healthy oils and fats. This information is not intended to replace advice given to you by your health care provider. Make sure you discuss any questions you have with your health care provider. Document Revised: 06/13/2017 Document Reviewed: 06/13/2017 Elsevier Patient Education  2020 [employer].

## 2019-09-13 NOTE — Progress Notes (Signed)
  Careteam: Patient Care Team: Eubanks, Jessica K, NP as PCP - General (Geriatric Medicine)  PLACE OF SERVICE:  [facility]  Advanced Directive information Does Patient Have a Medical Advance Directive?: No  No Known Allergies  Chief Complaint  Patient presents with  . Medical Management of Chronic Issues    6 Month Follow Up.  . Health Maintenance    Discuss the need for Colonoscopy.   . Immunizations    Discuss the need for Covid Vaccine, and PNA Vaccine.      HPI: Patient is a 68 y.o. male for routine follow up.   Reports recently completed cologuard   htn- well controlled on norvasc   Hx of cva- stable, no worsening of symptoms. 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 Still can play golf which is important to him.  Continues on asa  Continues on gabapentin 300 mg qhs  Hyperlipidemia- LDL at 90 on lipitor 40 mg daily. Trying to do a Heart healthy diet  ckd- attempting to drink more water, avoid NSAIDs  Anemia- stable, no on supplement.   Seasonal allergies- controlled on zyrtec 10 mg daily  Got both COVID vaccines, last one feb 5th.  Took 1 shingles shot plans to get the other one soon.   Review of Systems:  Review of Systems  Constitutional: Negative for chills, fever and weight loss.  HENT: Negative for tinnitus.   Respiratory: Negative for cough, sputum production and shortness of breath.   Cardiovascular: Negative for chest pain, palpitations and leg swelling.  Gastrointestinal: Negative for abdominal pain, constipation, diarrhea and heartburn.  Genitourinary: Negative for dysuria, frequency and urgency.       Gets up 2 times at night but also drinking more water  Musculoskeletal: Negative for back pain, falls, joint pain and myalgias.  Skin: Negative.   Neurological: Negative for dizziness and headaches.  Psychiatric/Behavioral: Negative for depression and memory loss. The patient does not have insomnia.    Past Medical History:  Diagnosis Date  . Allergy   .  Arthritis    hand  . Chronic kidney disease    kidney stone "long time ago"  . Hyperlipidemia   . Hypertension   . Stroke ([facility]) 2019   Sep 22, 2017   Past Surgical History:  Procedure Laterality Date  . sarcidosis surgery- 1984     pt states he isn't sure what was involved with this, possible lymph nodes removed   Social History:   reports that he has quit smoking. He has quit using smokeless tobacco.  His smokeless tobacco use included chew. He reports previous alcohol use. He reports previous drug use.  Family History  Problem Relation Age of Onset  . Pancreatic cancer Mother   . Cancer Neg Hx   . Hypertension Neg Hx   . Heart disease Neg Hx   . Colon cancer Neg Hx   . Esophageal cancer Neg Hx   . Rectal cancer Neg Hx   . Stomach cancer Neg Hx     Medications: Patient's Medications  New Prescriptions   No medications on file  Previous Medications   AMLODIPINE (NORVASC) 10 MG TABLET    Take 1 tablet (10 mg total) by mouth daily.   ASPIRIN 325 MG EC TABLET    Take 325 mg by mouth daily.   ATORVASTATIN (LIPITOR) 40 MG TABLET    Take 1 tablet (40 mg total) by mouth daily.   CETIRIZINE HCL (ZYRTEC PO)    Take by mouth.   COENZYME Q10   100 MG CAPSULE    Take 2 capsules (200 mg total) by mouth daily.   GABAPENTIN (NEURONTIN) 300 MG CAPSULE    TAKE 1 CAPSULE(300 MG) BY MOUTH AT BEDTIME  Modified Medications   No medications on file  Discontinued Medications   NA SULFATE-K SULFATE-MG SULF (SUPREP BOWEL PREP KIT) 17.5-3.13-1.6 GM/177ML SOLN    Suprep as directed, no substitutions    Physical Exam:  Vitals:   09/13/19 1440  BP: 120/88  Pulse: 69  Resp: 16  Temp: 98 F (36.7 C)  SpO2: 98%  Weight: 161 lb 8 oz (73.3 kg)  Height: 5' 8" (1.727 m)   Body mass index is 24.56 kg/m. Wt Readings from Last 3 Encounters:  09/13/19 161 lb 8 oz (73.3 kg)  03/12/19 159 lb 3.2 oz (72.2 kg)  09/30/18 156 lb 3.2 oz (70.9 kg)    Physical Exam Constitutional:      General: He  is not in acute distress.    Appearance: He is well-developed. He is not diaphoretic.  HENT:     Head: Normocephalic and atraumatic.     Mouth/Throat:     Pharynx: No oropharyngeal exudate.  Eyes:     Conjunctiva/sclera: Conjunctivae normal.     Pupils: Pupils are equal, round, and reactive to light.  Cardiovascular:     Rate and Rhythm: Normal rate and regular rhythm.     Heart sounds: Normal heart sounds.  Pulmonary:     Effort: Pulmonary effort is normal.     Breath sounds: Normal breath sounds.  Abdominal:     General: Bowel sounds are normal.     Palpations: Abdomen is soft.  Musculoskeletal:        General: No tenderness.     Cervical back: Normal range of motion and neck supple.  Skin:    General: Skin is warm and dry.  Neurological:     Mental Status: He is alert and oriented to person, place, and time. Mental status is at baseline.     Comments: Dysarthric speech due to prior CVA  Psychiatric:        Mood and Affect: Mood normal.        Behavior: Behavior normal.     Labs reviewed: Basic Metabolic Panel: Recent Labs    09/25/18 0802 03/08/19 0827 09/09/19 0805  NA 140 143 142  K 4.2 5.1 5.2  CL 102 106 105  CO2 32 21 29  GLUCOSE 85 93 96  BUN _0 CREATININE 1.53* 1.38* 1.46*  CALCIUM 9.7 9.9 9.9   Liver Function Tests: Recent Labs    09/25/18 0802 03/08/19 0827 09/09/19 0805  AST _1 ALT _2 BILITOT 0.9 0.5 0.9  PROT 7.1 7.2 7.4   No results for input(s): LIPASE, AMYLASE in the last 8760 hours. No results for input(s): AMMONIA in the last 8760 hours. CBC: Recent Labs    09/25/18 0802 03/08/19 0827 09/09/19 0805  WBC 6.9 5.6 5.7  NEUTROABS 2,912 2,302 2,599  HGB 14.1 13.8 13.5  HCT 42.4 44.3 42.1  MCV 79.8* 83.0 80.5  PLT 207 195 237   Lipid Panel: Recent Labs    09/25/18 0802 03/08/19 0827 09/09/19 0805  CHOL 174 162 167  HDL 58 59 55  LDLCALC 94 82 90  TRIG 119 113 128  CHOLHDL 3.0 2.7 3.0   TSH: No  results for input(s): TSH in the last 8760 hours. A1C: Lab Results  Component Value Date  HGBA1C 5.9 (H) 09/09/2019     Assessment/Plan 1. Mixed hyperlipidemia -LDL 90, not at goal <70. To stop Lipitor and start Crestor at this time. Continue to work on dietary modifications. - rosuvastatin (CRESTOR) 20 MG tablet; Take 1 tablet (20 mg total) by mouth daily.  Dispense: 90 tablet; Refill: 3 - Lipid Panel; Future - COMPLETE METABOLIC PANEL WITH GFR; Future  2. Essential hypertension -controlled on norvasc 10 mg daily, continue to work on dietary modifications.   3. 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   4. Urinary frequency -ongoing, reports could be due to increase in water intake, however will follow up PSA as well.  - PSA; Future  5. Need for 23-polyvalent pneumococcal polysaccharide vaccine - Pneumococcal polysaccharide vaccine 23-valent greater than or equal to 2yo subcutaneous/IM  6. 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  Jessica K. Eubanks, AGNP  [facility] [phone]  

## 2019-09-15 ENCOUNTER — Telehealth: Payer: Self-pay

## 2019-09-15 LAB — COLOGUARD
COLOGUARD: NEGATIVE
Cologuard: NEGATIVE

## 2019-09-15 NOTE — Telephone Encounter (Signed)
April, daughter notified and agreed.  

## 2019-09-15 NOTE — Telephone Encounter (Signed)
Patient was called to be notified of Negative Cologuard report. Patient didn't answer the phone. Voicemail was left with office call back number.

## 2019-10-11 ENCOUNTER — Other Ambulatory Visit: Payer: Self-pay | Admitting: *Deleted

## 2019-10-11 DIAGNOSIS — I10 Essential (primary) hypertension: Secondary | ICD-10-CM

## 2019-10-11 MED ORDER — AMLODIPINE BESYLATE 10 MG PO TABS: 10.0000 mg | ORAL_TABLET | Freq: Every day | ORAL | 0 refills | Status: DC

## 2019-10-11 NOTE — Telephone Encounter (Signed)
Pharmacy requested refill

## 2019-10-28 ENCOUNTER — Other Ambulatory Visit: Payer: Self-pay | Admitting: *Deleted

## 2019-10-28 DIAGNOSIS — G5691 Unspecified mononeuropathy of right upper limb: Secondary | ICD-10-CM

## 2019-10-28 MED ORDER — GABAPENTIN 300 MG PO CAPS: ORAL_CAPSULE | ORAL | 0 refills | Status: DC

## 2019-10-28 NOTE — Telephone Encounter (Signed)
Joselyn Glassman with [facility] in [place] called requesting refill for patient

## 2019-12-14 ENCOUNTER — Other Ambulatory Visit: Payer: Self-pay

## 2019-12-14 ENCOUNTER — Other Ambulatory Visit: Payer: Medicare PPO

## 2019-12-14 DIAGNOSIS — E782 Mixed hyperlipidemia: Secondary | ICD-10-CM

## 2019-12-14 DIAGNOSIS — R350 Frequency of micturition: Secondary | ICD-10-CM

## 2019-12-15 ENCOUNTER — Encounter: Payer: Self-pay | Admitting: Nurse Practitioner

## 2019-12-15 LAB — LIPID PANEL
Cholesterol: 159 mg/dL (ref <200)
HDL: 56 mg/dL (ref >=40)
LDL Cholesterol (Calc): 84 mg/dL (calc)
Non-HDL Cholesterol (Calc): 103 mg/dL (calc) (ref <130)
Total CHOL/HDL Ratio: 2.8 (calc) (ref <5.0)
Triglycerides: 99 mg/dL (ref <150)

## 2019-12-15 LAB — COMPLETE METABOLIC PANEL WITHOUT GFR
AG Ratio: 1.5 (calc) (ref 1.0–2.5)
ALT: 14 U/L (ref 9–46)
AST: 16 U/L (ref 10–35)
Albumin: 4.1 g/dL (ref 3.6–5.1)
Alkaline phosphatase (APISO): 73 U/L (ref 35–144)
BUN/Creatinine Ratio: 7 (calc) (ref 6–22)
BUN: 10 mg/dL (ref 7–25)
CO2: 25 mmol/L (ref 20–32)
Calcium: 9 mg/dL (ref 8.6–10.3)
Chloride: 104 mmol/L (ref 98–110)
Creat: 1.41 mg/dL — ABNORMAL HIGH (ref 0.70–1.18)
GFR, Est African American: 58 mL/min/{1.73_m2} — ABNORMAL LOW
GFR, Est Non African American: 50 mL/min/{1.73_m2} — ABNORMAL LOW
Globulin: 2.8 g/dL (ref 1.9–3.7)
Glucose, Bld: 86 mg/dL (ref 65–99)
Potassium: 4.3 mmol/L (ref 3.5–5.3)
Sodium: 138 mmol/L (ref 135–146)
Total Bilirubin: 0.5 mg/dL (ref 0.2–1.2)
Total Protein: 6.9 g/dL (ref 6.1–8.1)

## 2019-12-15 LAB — PSA: PSA: 2.1 ng/mL (ref <=4.0)

## 2019-12-16 ENCOUNTER — Other Ambulatory Visit: Payer: Self-pay

## 2019-12-16 DIAGNOSIS — E782 Mixed hyperlipidemia: Secondary | ICD-10-CM

## 2020-01-11 ENCOUNTER — Other Ambulatory Visit: Payer: Self-pay | Admitting: Nurse Practitioner

## 2020-01-11 DIAGNOSIS — I10 Essential (primary) hypertension: Secondary | ICD-10-CM

## 2020-01-11 NOTE — Telephone Encounter (Signed)
Joselyn Glassman from [facility] called requesting refill for patient.

## 2020-02-03 ENCOUNTER — Encounter: Payer: Self-pay | Admitting: Nurse Practitioner

## 2020-02-03 DIAGNOSIS — G5691 Unspecified mononeuropathy of right upper limb: Secondary | ICD-10-CM

## 2020-02-04 MED ORDER — GABAPENTIN 300 MG PO CAPS: ORAL_CAPSULE | ORAL | 0 refills | Status: DC

## 2020-03-13 ENCOUNTER — Other Ambulatory Visit: Payer: Self-pay

## 2020-03-13 ENCOUNTER — Ambulatory Visit (INDEPENDENT_AMBULATORY_CARE_PROVIDER_SITE_OTHER): Payer: Medicare PPO | Admitting: Nurse Practitioner

## 2020-03-13 ENCOUNTER — Encounter: Payer: Self-pay | Admitting: Nurse Practitioner

## 2020-03-13 VITALS — BP 138/86 | HR 64 | Temp 97.1°F | Ht 68.0 in | Wt 159.0 lb

## 2020-03-13 DIAGNOSIS — I10 Essential (primary) hypertension: Secondary | ICD-10-CM | POA: Diagnosis not present

## 2020-03-13 DIAGNOSIS — Z23 Encounter for immunization: Secondary | ICD-10-CM | POA: Diagnosis not present

## 2020-03-13 DIAGNOSIS — G5691 Unspecified mononeuropathy of right upper limb: Secondary | ICD-10-CM

## 2020-03-13 DIAGNOSIS — I69322 Dysarthria following cerebral infarction: Secondary | ICD-10-CM

## 2020-03-13 DIAGNOSIS — Z1159 Encounter for screening for other viral diseases: Secondary | ICD-10-CM

## 2020-03-13 DIAGNOSIS — E782 Mixed hyperlipidemia: Secondary | ICD-10-CM | POA: Diagnosis not present

## 2020-03-13 LAB — LIPID PANEL
Cholesterol: 160 mg/dL (ref <200)
HDL: 66 mg/dL (ref >=40)
LDL Cholesterol (Calc): 76 mg/dL (calc)
Non-HDL Cholesterol (Calc): 94 mg/dL (calc) (ref <130)
Total CHOL/HDL Ratio: 2.4 (calc) (ref <5.0)
Triglycerides: 96 mg/dL (ref <150)

## 2020-03-13 LAB — CBC WITH DIFFERENTIAL/PLATELET
Absolute Monocytes: 594 cells/uL (ref 200–950)
Basophils Absolute: 32 cells/uL (ref 0–200)
Basophils Relative: 0.6 %
Eosinophils Absolute: 143 cells/uL (ref 15–500)
Eosinophils Relative: 2.7 %
HCT: 42.1 % (ref 38.5–50.0)
Hemoglobin: 13.4 g/dL (ref 13.2–17.1)
Lymphs Abs: 2094 cells/uL (ref 850–3900)
MCH: 25.7 pg — ABNORMAL LOW (ref 27.0–33.0)
MCHC: 31.8 g/dL — ABNORMAL LOW (ref 32.0–36.0)
MCV: 80.8 fL (ref 80.0–100.0)
MPV: 11.5 fL (ref 7.5–12.5)
Monocytes Relative: 11.2 %
Neutro Abs: 2438 cells/uL (ref 1500–7800)
Neutrophils Relative %: 46 %
Platelets: 211 10*3/uL (ref 140–400)
RBC: 5.21 10*6/uL (ref 4.20–5.80)
RDW: 13.7 % (ref 11.0–15.0)
Total Lymphocyte: 39.5 %
WBC: 5.3 10*3/uL (ref 3.8–10.8)

## 2020-03-13 LAB — COMPLETE METABOLIC PANEL WITH GFR
AG Ratio: 1.4 (calc) (ref 1.0–2.5)
ALT: 12 U/L (ref 9–46)
AST: 21 U/L (ref 10–35)
Albumin: 4.3 g/dL (ref 3.6–5.1)
Alkaline phosphatase (APISO): 70 U/L (ref 35–144)
BUN/Creatinine Ratio: 10 (calc) (ref 6–22)
BUN: 12 mg/dL (ref 7–25)
CO2: 28 mmol/L (ref 20–32)
Calcium: 9.6 mg/dL (ref 8.6–10.3)
Chloride: 103 mmol/L (ref 98–110)
Creat: 1.23 mg/dL — ABNORMAL HIGH (ref 0.70–1.18)
GFR, Est African American: 69 mL/min/{1.73_m2} (ref >=60)
GFR, Est Non African American: 59 mL/min/{1.73_m2} — ABNORMAL LOW (ref >=60)
Globulin: 3 g/dL (calc) (ref 1.9–3.7)
Glucose, Bld: 82 mg/dL (ref 65–99)
Potassium: 4.4 mmol/L (ref 3.5–5.3)
Sodium: 139 mmol/L (ref 135–146)
Total Bilirubin: 0.9 mg/dL (ref 0.2–1.2)
Total Protein: 7.3 g/dL (ref 6.1–8.1)

## 2020-03-13 LAB — HEPATITIS C ANTIBODY
Hepatitis C Ab: NONREACTIVE
SIGNAL TO CUT-OFF: 0.01 (ref <1.00)

## 2020-03-13 NOTE — Progress Notes (Signed)
Careteam: Patient Care Team: Randall Chandler, NP as PCP - General (Geriatric Medicine)  PLACE OF SERVICE:  [place] Directive information Does Patient Have a Medical Advance Directive?: No, Would patient like information on creating a medical advance directive?: Yes (MAU/Ambulatory/Procedural Areas - Information given) (Given at a previous visit)  No Known Allergies  Chief Complaint  Patient presents with  . Medical Management of Chronic Issues    6 month follow-up. Flu vaccine today. Discuss need for colonoscopy.      HPI: Patient is a 69 y.o. male for routine follow up.   Completed cologuard.  Allergies- controlled on zyrtec   Hx of CVA- continues on ASA 325 mg daily.  Continues to play golf.  Continues to have neuropathy and on gabapentin.   htn- at goal on norvasc. No chest pains or shortness of breath  Hyperlipidemia- on crestor, attempts heart healthy diet.   Walks for exercise.   Review of Systems:  Review of Systems  Constitutional: Negative for chills, fever and weight loss.  HENT: Negative for tinnitus.   Respiratory: Negative for cough, sputum production and shortness of breath.   Cardiovascular: Negative for chest pain, palpitations and leg swelling.  Gastrointestinal: Negative for abdominal pain, constipation, diarrhea and heartburn.  Genitourinary: Negative for dysuria, frequency and urgency.  Musculoskeletal: Negative for back pain, falls, joint pain and myalgias.  Skin: Negative.   Neurological: Negative for dizziness and headaches.  Psychiatric/Behavioral: Negative for depression and memory loss. The patient does not have insomnia.     Past Medical History:  Diagnosis Date  . Allergy   . Arthritis    hand  . Chronic kidney disease    kidney stone "long time ago"  . Hyperlipidemia   . Hypertension   . Stroke [facility]) 2019   Sep 22, 2017   Past Surgical History:  Procedure Laterality Date  . sarcidosis surgery- 1984     pt  states he isn't sure what was involved with this, possible lymph nodes removed   Social History:   reports that he has quit smoking. He has quit using smokeless tobacco.  His smokeless tobacco use included chew. He reports previous alcohol use. He reports previous drug use.  Family History  Problem Relation Age of Onset  . Pancreatic cancer Mother   . Cancer Neg Hx   . Hypertension Neg Hx   . Heart disease Neg Hx   . Colon cancer Neg Hx   . Esophageal cancer Neg Hx   . Rectal cancer Neg Hx   . Stomach cancer Neg Hx     Medications: Patient's Medications  New Prescriptions   No medications on file  Previous Medications   AMLODIPINE (NORVASC) 10 MG TABLET    TAKE 1 TABLET(10 MG) BY MOUTH DAILY   ASPIRIN 325 MG EC TABLET    Take 325 mg by mouth daily.   CETIRIZINE HCL (ZYRTEC PO)    Take by mouth.   COENZYME Q10 100 MG CAPSULE    Take 2 capsules (200 mg total) by mouth daily.   GABAPENTIN (NEURONTIN) 300 MG CAPSULE    TAKE 1 CAPSULE(300 MG) BY MOUTH AT BEDTIME   ROSUVASTATIN (CRESTOR) 20 MG TABLET    Take 1 tablet (20 mg total) by mouth daily.  Modified Medications   No medications on file  Discontinued Medications   No medications on file    Physical Exam:  Vitals:   03/13/20 1010  BP: 138/86  Pulse: 64  Temp: (!) 97.1 F (36.2 C)  TempSrc: Temporal  SpO2: 98%  Weight: 159 lb (72.1 kg)  Height: _0  (1.727 m)   Body mass index is 24.18 kg/m. Wt Readings from Last 3 Encounters:  03/13/20 159 lb (72.1 kg)  09/13/19 161 lb 8 oz (73.3 kg)  03/12/19 159 lb 3.2 oz (72.2 kg)    Physical Exam Constitutional:      General: He is not in acute distress.    Appearance: He is well-developed. He is not diaphoretic.  HENT:     Head: Normocephalic and atraumatic.  Eyes:     Conjunctiva/sclera: Conjunctivae normal.     Pupils: Pupils are equal, round, and reactive to light.  Cardiovascular:     Rate and Rhythm: Normal rate and regular rhythm.     Heart sounds: Normal  heart sounds.  Pulmonary:     Effort: Pulmonary effort is normal.     Breath sounds: Normal breath sounds.  Abdominal:     General: Bowel sounds are normal.     Palpations: Abdomen is soft.  Musculoskeletal:        General: No tenderness.     Cervical back: Normal range of motion and neck supple.  Skin:    General: Skin is warm and dry.  Neurological:     Mental Status: He is alert and oriented to person, place, and time.  Psychiatric:        Mood and Affect: Mood normal.        Behavior: Behavior normal.     Labs reviewed: Basic Metabolic Panel: Recent Labs    09/09/19 0805 12/14/19 0804  NA 142 138  K 5.2 4.3  CL 105 104  CO2 29 25  GLUCOSE 96 86  BUN 10 10  CREATININE 1.46* 1.41*  CALCIUM 9.9 9.0   Liver Function Tests: Recent Labs    09/09/19 0805 12/14/19 0804  AST 19 16  ALT 16 14  BILITOT 0.9 0.5  PROT 7.4 6.9   No results for input(s): LIPASE, AMYLASE in the last 8760 hours. No results for input(s): AMMONIA in the last 8760 hours. CBC: Recent Labs    09/09/19 0805  WBC 5.7  NEUTROABS 2,599  HGB 13.5  HCT 42.1  MCV 80.5  PLT 237   Lipid Panel: Recent Labs    09/09/19 0805 12/14/19 0804  CHOL 167 159  HDL 55 56  LDLCALC 90 84  TRIG 128 99  CHOLHDL 3.0 2.8   TSH: No results for input(s): TSH in the last 8760 hours. A1C: Lab Results  Component Value Date   HGBA1C 5.9 (H) 09/09/2019     Assessment/Plan 1. Need for influenza vaccination - Flu Vaccine QUAD High Dose(Fluad)  2. Mixed hyperlipidemia Continues on crestor 20 mg daily with dietary modifications. - CMP with eGFR([facility]) - Lipid Panel  3. Essential hypertension -controlled on norvasc 10 mg daily with dietary modifications. - CMP with eGFR([facility]) - CBC with Differential/Platelet  4. 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  5. Neuropathy, upper extremity, right Stable on gabapetin 300 mg at bedtime.   6.  Need for hepatitis C screening test - Hepatitis C antibody   Next appt: 6 months.  Carlos [employer]. [place], [facility] [phone]

## 2020-05-09 ENCOUNTER — Other Ambulatory Visit: Payer: Self-pay | Admitting: Nurse Practitioner

## 2020-05-09 DIAGNOSIS — G5691 Unspecified mononeuropathy of right upper limb: Secondary | ICD-10-CM

## 2020-05-17 ENCOUNTER — Ambulatory Visit: Payer: Medicare PPO | Admitting: Nurse Practitioner

## 2020-05-17 ENCOUNTER — Other Ambulatory Visit: Payer: Self-pay

## 2020-05-17 ENCOUNTER — Telehealth: Payer: Self-pay

## 2020-05-17 NOTE — Progress Notes (Signed)
No answer atfer 3 attempts, visit will be un-arrived.

## 2020-05-17 NOTE — Telephone Encounter (Signed)
Patient was scheduled for an appointment today via telephone call and after 3 unsuccessful attempts to reach patient by phone appointment was un-arrived.   1st attempt 3:08 pm 2 nd attempt 3:17 pm 3 rd attempt 3:25 pm  A voicemail message was left at every attempt. A message was sent to the administrative staff to follow no show protocol in attempts to reschedule appointment in the near future

## 2020-06-03 ENCOUNTER — Other Ambulatory Visit: Payer: Self-pay | Admitting: Nurse Practitioner

## 2020-06-03 DIAGNOSIS — E782 Mixed hyperlipidemia: Secondary | ICD-10-CM

## 2020-06-06 ENCOUNTER — Other Ambulatory Visit: Payer: Self-pay | Admitting: Nurse Practitioner

## 2020-06-06 ENCOUNTER — Encounter: Payer: Self-pay | Admitting: Nurse Practitioner

## 2020-06-06 DIAGNOSIS — E782 Mixed hyperlipidemia: Secondary | ICD-10-CM

## 2020-07-02 ENCOUNTER — Other Ambulatory Visit: Payer: Self-pay | Admitting: Nurse Practitioner

## 2020-07-02 DIAGNOSIS — I10 Essential (primary) hypertension: Secondary | ICD-10-CM

## 2020-07-05 ENCOUNTER — Encounter: Payer: Self-pay | Admitting: Nurse Practitioner

## 2020-08-29 ENCOUNTER — Other Ambulatory Visit: Payer: Self-pay | Admitting: Nurse Practitioner

## 2020-08-29 DIAGNOSIS — E782 Mixed hyperlipidemia: Secondary | ICD-10-CM

## 2020-09-11 ENCOUNTER — Ambulatory Visit: Payer: Medicare PPO | Admitting: Nurse Practitioner

## 2020-10-09 ENCOUNTER — Encounter: Payer: Self-pay | Admitting: Nurse Practitioner

## 2020-10-09 ENCOUNTER — Other Ambulatory Visit: Payer: Self-pay

## 2020-10-09 ENCOUNTER — Ambulatory Visit: Payer: Medicare PPO | Admitting: Nurse Practitioner

## 2020-10-09 VITALS — BP 140/98 | HR 70 | Temp 96.9°F | Resp 16 | Ht 68.0 in | Wt 161.8 lb

## 2020-10-09 DIAGNOSIS — I69322 Dysarthria following cerebral infarction: Secondary | ICD-10-CM | POA: Diagnosis not present

## 2020-10-09 DIAGNOSIS — R7303 Prediabetes: Secondary | ICD-10-CM

## 2020-10-09 DIAGNOSIS — E782 Mixed hyperlipidemia: Secondary | ICD-10-CM | POA: Diagnosis not present

## 2020-10-09 DIAGNOSIS — I10 Essential (primary) hypertension: Secondary | ICD-10-CM

## 2020-10-09 DIAGNOSIS — G5691 Unspecified mononeuropathy of right upper limb: Secondary | ICD-10-CM

## 2020-10-09 NOTE — Patient Instructions (Signed)
Goal blood pressure is less than 140/90   https://www.mata.com/.pdf">  DASH Eating Plan DASH stands for Dietary Approaches to Stop Hypertension. The DASH eating plan is a healthy eating plan that has been shown to:  Reduce high blood pressure (hypertension).  Reduce your risk for type 2 diabetes, heart disease, and stroke.  Help with weight loss. What are tips for following this plan? Reading food labels  Check food labels for the amount of salt (sodium) per serving. Choose foods with less than 5 percent of the Daily Value of sodium. Generally, foods with less than 300 milligrams (mg) of sodium per serving fit into this eating plan.  To find whole grains, look for the word "whole" as the first word in the ingredient list. Shopping  Buy products labeled as "low-sodium" or "no salt added."  Buy fresh foods. Avoid canned foods and pre-made or frozen meals. Cooking  Avoid adding salt when cooking. Use salt-free seasonings or herbs instead of table salt or sea salt. Check with your health care provider or pharmacist before using salt substitutes.  Do not fry foods. Cook foods using healthy methods such as baking, boiling, grilling, roasting, and broiling instead.  Cook with heart-healthy oils, such as olive, canola, avocado, soybean, or sunflower oil. Meal planning  Eat a balanced diet that includes: ? 4 or more servings of fruits and 4 or more servings of vegetables each day. Try to fill one-half of your plate with fruits and vegetables. ? 6-8 servings of whole grains each day. ? Less than 6 oz (170 g) of lean meat, poultry, or fish each day. A 3-oz (85-g) serving of meat is about the same size as a deck of cards. One egg equals 1 oz (28 g). ? 2-3 servings of low-fat dairy each day. One serving is 1 cup (237 mL). ? 1 serving of nuts, seeds, or beans 5 times each week. ? 2-3 servings of heart-healthy fats. Healthy fats called omega-3 fatty acids  are found in foods such as walnuts, flaxseeds, fortified milks, and eggs. These fats are also found in cold-water fish, such as sardines, salmon, and mackerel.  Limit how much you eat of: ? Canned or prepackaged foods. ? Food that is high in trans fat, such as some fried foods. ? Food that is high in saturated fat, such as fatty meat. ? Desserts and other sweets, sugary drinks, and other foods with added sugar. ? Full-fat dairy products.  Do not salt foods before eating.  Do not eat more than 4 egg yolks a week.  Try to eat at least 2 vegetarian meals a week.  Eat more home-cooked food and less restaurant, buffet, and fast food.   Lifestyle  When eating at a restaurant, ask that your food be prepared with less salt or no salt, if possible.  If you drink alcohol: ? Limit how much you use to:  0-1 drink a day for women who are not pregnant.  0-2 drinks a day for men. ? Be aware of how much alcohol is in your drink. In the [place] one drink equals one 12 oz bottle of beer (355 mL), one 5 oz glass of wine (148 mL), or one 1 oz glass of hard liquor (44 mL). General information  Avoid eating more than 2,300 mg of salt a day. If you have hypertension, you may need to reduce your sodium intake to 1,500 mg a day.  Work with your health care provider to maintain a healthy body weight or to  lose weight. Ask what an ideal weight is for you.  Get at least 30 minutes of exercise that causes your heart to beat faster (aerobic exercise) most days of the week. Activities may include walking, swimming, or biking.  Work with your health care provider or dietitian to adjust your eating plan to your individual calorie needs. What foods should I eat? Fruits All fresh, dried, or frozen fruit. Canned fruit in natural juice (without added sugar). Vegetables Fresh or frozen vegetables (raw, steamed, roasted, or grilled). Low-sodium or reduced-sodium tomato and vegetable juice. Low-sodium or  reduced-sodium tomato sauce and tomato paste. Low-sodium or reduced-sodium canned vegetables. Grains Whole-grain or whole-wheat bread. Whole-grain or whole-wheat pasta. Brown rice. Orpah Cobb. Bulgur. Whole-grain and low-sodium cereals. Pita bread. Low-fat, low-sodium crackers. Whole-wheat flour tortillas. Meats and other proteins Skinless chicken or [place]. Ground chicken or [place]. Pork with fat trimmed off. Fish and seafood. Egg whites. Dried beans, peas, or lentils. Unsalted nuts, nut butters, and seeds. Unsalted canned beans. Lean cuts of beef with fat trimmed off. Low-sodium, lean precooked or cured meat, such as sausages or meat loaves. Dairy Low-fat (1%) or fat-free (skim) milk. Reduced-fat, low-fat, or fat-free cheeses. Nonfat, low-sodium ricotta or cottage cheese. Low-fat or nonfat yogurt. Low-fat, low-sodium cheese. Fats and oils Soft margarine without trans fats. Vegetable oil. Reduced-fat, low-fat, or light mayonnaise and salad dressings (reduced-sodium). Canola, safflower, olive, avocado, soybean, and sunflower oils. Avocado. Seasonings and condiments Herbs. Spices. Seasoning mixes without salt. Other foods Unsalted popcorn and pretzels. Fat-free sweets. The items listed above may not be a complete list of foods and beverages you can eat. Contact a dietitian for more information. What foods should I avoid? Fruits Canned fruit in a light or heavy syrup. Fried fruit. Fruit in cream or butter sauce. Vegetables Creamed or fried vegetables. Vegetables in a cheese sauce. Regular canned vegetables (not low-sodium or reduced-sodium). Regular canned tomato sauce and paste (not low-sodium or reduced-sodium). Regular tomato and vegetable juice (not low-sodium or reduced-sodium). Rosita Fire. Olives. Grains Baked goods made with fat, such as croissants, muffins, or some breads. Dry pasta or rice meal packs. Meats and other proteins Fatty cuts of meat. Ribs. Fried meat. Tomasa Blase. Bologna,  salami, and other precooked or cured meats, such as sausages or meat loaves. Fat from the back of a pig (fatback). Bratwurst. Salted nuts and seeds. Canned beans with added salt. Canned or smoked fish. Whole eggs or egg yolks. Chicken or [place] with skin. Dairy Whole or 2% milk, cream, and half-and-half. Whole or full-fat cream cheese. Whole-fat or sweetened yogurt. Full-fat cheese. Nondairy creamers. Whipped toppings. Processed cheese and cheese spreads. Fats and oils Butter. Stick margarine. Lard. Shortening. Ghee. Bacon fat. Tropical oils, such as coconut, palm kernel, or palm oil. Seasonings and condiments Onion salt, garlic salt, seasoned salt, table salt, and sea salt. Worcestershire sauce. Tartar sauce. Barbecue sauce. Teriyaki sauce. Soy sauce, including reduced-sodium. Steak sauce. Canned and packaged gravies. Fish sauce. Oyster sauce. Cocktail sauce. Store-bought horseradish. Ketchup. Mustard. Meat flavorings and tenderizers. Bouillon cubes. Hot sauces. Pre-made or packaged marinades. Pre-made or packaged taco seasonings. Relishes. Regular salad dressings. Other foods Salted popcorn and pretzels. The items listed above may not be a complete list of foods and beverages you should avoid. Contact a dietitian for more information. Where to find more information  National Heart, Lung, and Blood Institute: PopSteam.is  American Heart Association: www.heart.org  Academy of Nutrition and Dietetics: www.eatright.org  National Kidney Foundation: www.kidney.org Summary  The DASH eating plan is  a healthy eating plan that has been shown to reduce high blood pressure (hypertension). It may also reduce your risk for type 2 diabetes, heart disease, and stroke.  When on the DASH eating plan, aim to eat more fresh fruits and vegetables, whole grains, lean proteins, low-fat dairy, and heart-healthy fats.  With the DASH eating plan, you should limit salt (sodium) intake to 2,300 mg a day. If you  have hypertension, you may need to reduce your sodium intake to 1,500 mg a day.  Work with your health care provider or dietitian to adjust your eating plan to your individual calorie needs. This information is not intended to replace advice given to you by your health care provider. Make sure you discuss any questions you have with your health care provider. Document Revised: 04/09/2019 Document Reviewed: 04/09/2019 Elsevier Patient Education  2021 [employer].

## 2020-10-09 NOTE — Progress Notes (Signed)
Careteam: Patient Care Team: Lauree Chandler, NP as PCP - General (Geriatric Medicine)  PLACE OF SERVICE:  [place] Directive information Does Patient Have a Medical Advance Directive?: No, Would patient like information on creating a medical advance directive?: No - Patient declined  No Known Allergies  Chief Complaint  Patient presents with  . Medical Management of Chronic Issues    6 month follow up     HPI: Patient is a 70 y.o. male for routine follow up  htn- has not check blood pressure at home in a while. Did not take blood pressure medication today because he is fasting.   Continues to stay active. Coaching softball and playing golf.   Seasonal allergies- well controlled on zyrtec.   Hx of CVA- continues on asa   Review of Systems:  Review of Systems  Constitutional: Negative for chills, fever and weight loss.  HENT: Negative for tinnitus.   Respiratory: Negative for cough, sputum production and shortness of breath.   Cardiovascular: Negative for chest pain, palpitations and leg swelling.  Gastrointestinal: Negative for abdominal pain, constipation, diarrhea and heartburn.  Genitourinary: Negative for dysuria, frequency and urgency.  Musculoskeletal: Negative for back pain, falls, joint pain and myalgias.  Skin: Negative.   Neurological: Negative for dizziness and headaches.  Psychiatric/Behavioral: Negative for depression and memory loss. The patient does not have insomnia.     Past Medical History:  Diagnosis Date  . Allergy   . Arthritis    hand  . Chronic kidney disease    kidney stone "long time ago"  . Hyperlipidemia   . Hypertension   . Stroke [facility]) 2019   Sep 22, 2017   Past Surgical History:  Procedure Laterality Date  . sarcidosis surgery- 1984     pt states he isn't sure what was involved with this, possible lymph nodes removed   Social History:   reports that he has quit smoking. He has quit using smokeless tobacco.   His smokeless tobacco use included chew. He reports previous alcohol use. He reports previous drug use.  Family History  Problem Relation Age of Onset  . Pancreatic cancer Mother   . Cancer Neg Hx   . Hypertension Neg Hx   . Heart disease Neg Hx   . Colon cancer Neg Hx   . Esophageal cancer Neg Hx   . Rectal cancer Neg Hx   . Stomach cancer Neg Hx     Medications: Patient's Medications  New Prescriptions   No medications on file  Previous Medications   AMLODIPINE (NORVASC) 10 MG TABLET    TAKE 1 TABLET(10 MG) BY MOUTH DAILY   ASPIRIN 325 MG EC TABLET    Take 325 mg by mouth daily.   CETIRIZINE HCL (ZYRTEC PO)    Take by mouth.   COENZYME Q10 100 MG CAPSULE    Take 2 capsules (200 mg total) by mouth daily.   GABAPENTIN (NEURONTIN) 300 MG CAPSULE    TAKE 1 CAPSULE BY MOUTH EVERY NIGHT AT BEDTIME   ROSUVASTATIN (CRESTOR) 20 MG TABLET    TAKE 1 TABLET(20 MG) BY MOUTH DAILY  Modified Medications   No medications on file  Discontinued Medications   No medications on file    Physical Exam:  Vitals:   10/09/20 0943  BP: (!) 140/98  Pulse: 70  Resp: 16  Temp: (!) 96.9 F (36.1 C)  SpO2: 97%  Weight: 161 lb 12.8 oz (73.4 kg)  Height: _0  (  1.727 m)   Body mass index is 24.6 kg/m. Wt Readings from Last 3 Encounters:  10/09/20 161 lb 12.8 oz (73.4 kg)  03/13/20 159 lb (72.1 kg)  09/13/19 161 lb 8 oz (73.3 kg)    Physical Exam Constitutional:      General: He is not in acute distress.    Appearance: He is well-developed. He is not diaphoretic.  HENT:     Head: Normocephalic and atraumatic.     Mouth/Throat:     Pharynx: No oropharyngeal exudate.  Eyes:     Conjunctiva/sclera: Conjunctivae normal.     Pupils: Pupils are equal, round, and reactive to light.  Cardiovascular:     Rate and Rhythm: Normal rate and regular rhythm.     Heart sounds: Normal heart sounds.  Pulmonary:     Effort: Pulmonary effort is normal.     Breath sounds: Normal breath sounds.   Abdominal:     General: Bowel sounds are normal.     Palpations: Abdomen is soft.  Musculoskeletal:        General: No tenderness.     Cervical back: Normal range of motion and neck supple.  Skin:    General: Skin is warm and dry.  Neurological:     Mental Status: He is alert and oriented to person, place, and time.    Labs reviewed: Basic Metabolic Panel: Recent Labs    12/14/19 0804 03/13/20 1026  NA 138 139  K 4.3 4.4  CL 104 103  CO2 25 28  GLUCOSE 86 82  BUN 10 12  CREATININE 1.41* 1.23*  CALCIUM 9.0 9.6   Liver Function Tests: Recent Labs    12/14/19 0804 03/13/20 1026  AST 16 21  ALT 14 12  BILITOT 0.5 0.9  PROT 6.9 7.3   No results for input(s): LIPASE, AMYLASE in the last 8760 hours. No results for input(s): AMMONIA in the last 8760 hours. CBC: Recent Labs    03/13/20 1026  WBC 5.3  NEUTROABS 2,438  HGB 13.4  HCT 42.1  MCV 80.8  PLT 211   Lipid Panel: Recent Labs    12/14/19 0804 03/13/20 1026  CHOL 159 160  HDL 56 66  LDLCALC 84 76  TRIG 99 96  CHOLHDL 2.8 2.4   TSH: No results for input(s): TSH in the last 8760 hours. A1C: Lab Results  Component Value Date   HGBA1C 5.9 (H) 09/09/2019     Assessment/Plan 1. 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   2. Essential hypertension Did not take medication prior to visit. To monitor at home and notify, goal <140/90. - CMP with eGFR([facility]) - CBC with Differential/Platelet  3. Mixed hyperlipidemia -continues on crestor, encouraged dietary modifications.  - Lipid panel - CBC with Differential/Platelet  4. Neuropathy, upper extremity, right Controlled on gabapentin  5. Prediabetes -encouraged dietary modifications - Hemoglobin A1c  Next appt: 6 months.  Carlos American. [place], [facility] [phone]

## 2020-10-10 LAB — CBC WITH DIFFERENTIAL/PLATELET
Absolute Monocytes: 592 cells/uL (ref 200–950)
Basophils Absolute: 31 cells/uL (ref 0–200)
Basophils Relative: 0.5 %
Eosinophils Absolute: 171 cells/uL (ref 15–500)
Eosinophils Relative: 2.8 %
HCT: 45.1 % (ref 38.5–50.0)
Hemoglobin: 14.2 g/dL (ref 13.2–17.1)
Lymphs Abs: 1934 cells/uL (ref 850–3900)
MCH: 25.3 pg — ABNORMAL LOW (ref 27.0–33.0)
MCHC: 31.5 g/dL — ABNORMAL LOW (ref 32.0–36.0)
MCV: 80.4 fL (ref 80.0–100.0)
MPV: 11.6 fL (ref 7.5–12.5)
Monocytes Relative: 9.7 %
Neutro Abs: 3373 cells/uL (ref 1500–7800)
Neutrophils Relative %: 55.3 %
Platelets: 222 10*3/uL (ref 140–400)
RBC: 5.61 10*6/uL (ref 4.20–5.80)
RDW: 13.7 % (ref 11.0–15.0)
Total Lymphocyte: 31.7 %
WBC: 6.1 10*3/uL (ref 3.8–10.8)

## 2020-10-10 LAB — COMPLETE METABOLIC PANEL WITH GFR
AG Ratio: 1.5 (calc) (ref 1.0–2.5)
ALT: 14 U/L (ref 9–46)
AST: 13 U/L (ref 10–35)
Albumin: 4.7 g/dL (ref 3.6–5.1)
Alkaline phosphatase (APISO): 85 U/L (ref 35–144)
BUN/Creatinine Ratio: 6 (calc) (ref 6–22)
BUN: 8 mg/dL (ref 7–25)
CO2: 28 mmol/L (ref 20–32)
Calcium: 9.7 mg/dL (ref 8.6–10.3)
Chloride: 105 mmol/L (ref 98–110)
Creat: 1.38 mg/dL — ABNORMAL HIGH (ref 0.70–1.18)
GFR, Est African American: 59 mL/min/{1.73_m2} — ABNORMAL LOW (ref >=60)
GFR, Est Non African American: 51 mL/min/{1.73_m2} — ABNORMAL LOW (ref >=60)
Globulin: 3.2 g/dL (calc) (ref 1.9–3.7)
Glucose, Bld: 83 mg/dL (ref 65–99)
Potassium: 4.7 mmol/L (ref 3.5–5.3)
Sodium: 140 mmol/L (ref 135–146)
Total Bilirubin: 0.6 mg/dL (ref 0.2–1.2)
Total Protein: 7.9 g/dL (ref 6.1–8.1)

## 2020-10-10 LAB — LIPID PANEL
Cholesterol: 163 mg/dL (ref <200)
HDL: 64 mg/dL (ref >=40)
LDL Cholesterol (Calc): 79 mg/dL (calc)
Non-HDL Cholesterol (Calc): 99 mg/dL (calc) (ref <130)
Total CHOL/HDL Ratio: 2.5 (calc) (ref <5.0)
Triglycerides: 114 mg/dL (ref <150)

## 2020-10-10 LAB — HEMOGLOBIN A1C
Hgb A1c MFr Bld: 5.8 % of total Hgb — ABNORMAL HIGH (ref <5.7)
Mean Plasma Glucose: 120 mg/dL
eAG (mmol/L): 6.6 mmol/L

## 2020-10-19 ENCOUNTER — Encounter: Payer: Self-pay | Admitting: Nurse Practitioner

## 2020-10-20 ENCOUNTER — Encounter: Payer: Self-pay | Admitting: Adult Health

## 2020-10-20 ENCOUNTER — Ambulatory Visit: Payer: Medicare PPO | Admitting: Adult Health

## 2020-10-20 ENCOUNTER — Other Ambulatory Visit: Payer: Self-pay

## 2020-10-20 DIAGNOSIS — R350 Frequency of micturition: Secondary | ICD-10-CM

## 2020-10-20 LAB — POCT URINALYSIS DIPSTICK
Glucose, UA: NEGATIVE
Nitrite, UA: POSITIVE
Protein, UA: POSITIVE — AB
Spec Grav, UA: 1.02 (ref 1.010–1.025)
Urobilinogen, UA: NEGATIVE E.U./dL — AB
pH, UA: 5 (ref 5.0–8.0)

## 2020-10-20 MED ORDER — CIPROFLOXACIN HCL 500 MG PO TABS: 500.0000 mg | ORAL_TABLET | Freq: Two times a day (BID) | ORAL | 0 refills | Status: AC

## 2020-10-20 NOTE — Patient Instructions (Signed)
Urinary Tract Infection, Adult A urinary tract infection (UTI) is an infection of any part of the urinary tract. The urinary tract includes:  The kidneys.  The ureters.  The bladder.  The urethra. These organs make, store, and get rid of pee (urine) in the body. What are the causes? This infection is caused by germs (bacteria) in your genital area. These germs grow and cause swelling (inflammation) of your urinary tract. What increases the risk? The following factors may make you more likely to develop this condition:  Using a small, thin tube (catheter) to drain pee.  Not being able to control when you pee or poop (incontinence).  Being male. If you are male, these things can increase the risk: ? Using these methods to prevent pregnancy:  A medicine that kills sperm (spermicide).  A device that blocks sperm (diaphragm). ? Having low levels of a male hormone (estrogen). ? Being pregnant. You are more likely to develop this condition if:  You have genes that add to your risk.  You are sexually active.  You take antibiotic medicines.  You have trouble peeing because of: ? A prostate that is bigger than normal, if you are male. ? A blockage in the part of your body that drains pee from the bladder. ? A kidney stone. ? A nerve condition that affects your bladder. ? Not getting enough to drink. ? Not peeing often enough.  You have other conditions, such as: ? Diabetes. ? A weak disease-fighting system (immune system). ? Sickle cell disease. ? Gout. ? Injury of the spine. What are the signs or symptoms? Symptoms of this condition include:  Needing to pee right away.  Peeing small amounts often.  Pain or burning when peeing.  Blood in the pee.  Pee that smells bad or not like normal.  Trouble peeing.  Pee that is cloudy.  Fluid coming from the vagina, if you are male.  Pain in the belly or lower back. Other symptoms include:  Vomiting.  Not  feeling hungry.  Feeling mixed up (confused). This may be the first symptom in older adults.  Being tired and grouchy (irritable).  A fever.  Watery poop (diarrhea). How is this treated?  Taking antibiotic medicine.  Taking other medicines.  Drinking enough water. In some cases, you may need to see a specialist. Follow these instructions at home: Medicines  Take over-the-counter and prescription medicines only as told by your doctor.  If you were prescribed an antibiotic medicine, take it as told by your doctor. Do not stop taking it even if you start to feel better. General instructions  Make sure you: ? Pee until your bladder is empty. ? Do not hold pee for a long time. ? Empty your bladder after sex. ? Wipe from front to back after peeing or pooping if you are a male. Use each tissue one time when you wipe.  Drink enough fluid to keep your pee pale yellow.  Keep all follow-up visits.   Contact a doctor if:  You do not get better after 1-2 days.  Your symptoms go away and then come back. Get help right away if:  You have very bad back pain.  You have very bad pain in your lower belly.  You have a fever.  You have chills.  You feeling like you will vomit or you vomit. Summary  A urinary tract infection (UTI) is an infection of any part of the urinary tract.  This condition is caused by   germs in your genital area.  There are many risk factors for a UTI.  Treatment includes antibiotic medicines.  Drink enough fluid to keep your pee pale yellow. This information is not intended to replace advice given to you by your health care provider. Make sure you discuss any questions you have with your health care provider. Document Revised: 12/17/2019 Document Reviewed: 12/17/2019 Elsevier Patient Education  2021 Elsevier Inc.  

## 2020-10-20 NOTE — Progress Notes (Signed)
[facility]  Provider:   Kenard Gower  DNP  Code Status: Full Code  Goals of Care:  Advanced Directives 10/20/2020  Does Patient Have a Medical Advance Directive? No  Does patient want to make changes to medical advance directive? -  Would patient like information on creating a medical advance directive? No - Patient declined     Chief Complaint  Patient presents with   Acute Visit    Frequent urination for a week.     HPI: Patient is a 70 y.o. male seen today for an acute visit for frequent urination X 1 week. He denies having hematuria nor fever. He denies having nausea and vomiting. Urine dipstick today showed urine as cloudy, large ketones, large blood, positive nitrites and large leukocyte.    Past Medical History:  Diagnosis Date   Allergy    Arthritis    hand   Chronic kidney disease    kidney stone "long time ago"   Hyperlipidemia    Hypertension    Stroke [facility]) 2019   Sep 22, 2017    Past Surgical History:  Procedure Laterality Date   sarcidosis surgery- 1984     pt states he isn't sure what was involved with this, possible lymph nodes removed    No Known Allergies  Outpatient Encounter Medications as of 10/20/2020  Medication Sig   amLODipine (NORVASC) 10 MG tablet TAKE 1 TABLET(10 MG) BY MOUTH DAILY   aspirin 325 MG EC tablet Take 325 mg by mouth daily.   Cetirizine HCl (ZYRTEC PO) Take by mouth.   Coenzyme Q10 100 MG capsule Take 2 capsules (200 mg total) by mouth daily.   gabapentin (NEURONTIN) 300 MG capsule TAKE 1 CAPSULE BY MOUTH EVERY NIGHT AT BEDTIME   rosuvastatin (CRESTOR) 20 MG tablet TAKE 1 TABLET(20 MG) BY MOUTH DAILY   No facility-administered encounter medications on file as of 10/20/2020.    Review of Systems:  Review of Systems  Constitutional: Positive for fever. Negative for activity change and appetite change.  HENT: Negative for congestion and mouth sores.   Eyes: Negative for discharge and itching.  Respiratory: Negative  for cough and wheezing.   Cardiovascular: Negative for leg swelling.  Genitourinary: Positive for urgency. Negative for difficulty urinating and dysuria.  Musculoskeletal: Negative for back pain.  Skin: Negative for rash.  Neurological: Negative for dizziness and headaches.  Psychiatric/Behavioral: Negative for agitation and behavioral problems.    Health Maintenance  Topic Date Due   Pneumococcal Vaccine 68-46 Years old (1 of 4 - PCV13) Never done   Zoster Vaccines- Shingrix (2 of 2) 06/05/2019   INFLUENZA VACCINE  12/18/2020   Fecal DNA (Cologuard)  09/15/2022   TETANUS/TDAP  04/09/2029   COVID-19 Vaccine  Completed   PNA vac Low Risk Adult  Completed   HPV VACCINES  Aged Out   Hepatitis C Screening  Discontinued    Physical Exam: Vitals:   10/20/20 1403  BP: 128/90  Pulse: 84  Resp: 16  Temp: (!) 97.5 F (36.4 C)  SpO2: 98%  Weight: 160 lb (72.6 kg)  Height: 5\' 8"  (1.727 m)   Body mass index is 24.33 kg/m. Physical Exam HENT:     Head: Atraumatic.  Eyes:     Conjunctiva/sclera: Conjunctivae normal.  Cardiovascular:     Rate and Rhythm: Normal rate and regular rhythm.  Pulmonary:     Effort: Pulmonary effort is normal.     Breath sounds: Normal breath sounds.  Abdominal:  General: Bowel sounds are normal.     Palpations: Abdomen is soft.  Musculoskeletal:     Cervical back: Normal range of motion and neck supple.  Skin:    General: Skin is warm and dry.  Neurological:     Mental Status: He is alert and oriented to person, place, and time.     Comments: Slurred speech  Psychiatric:        Mood and Affect: Mood normal.        Behavior: Behavior normal.     Labs reviewed: Basic Metabolic Panel: Recent Labs    12/14/19 0804 03/13/20 1026 10/09/20 1005  NA 138 139 140  K 4.3 4.4 4.7  CL 104 103 105  CO2 25 28 28   GLUCOSE 86 82 83  BUN 10 12 8   CREATININE 1.41* 1.23* 1.38*  CALCIUM 9.0 9.6 9.7   Liver Function Tests: Recent Labs     12/14/19 0804 03/13/20 1026 10/09/20 1005  AST 16 21 13   ALT 14 12 14   BILITOT 0.5 0.9 0.6  PROT 6.9 7.3 7.9   CBC: Recent Labs    03/13/20 1026 10/09/20 1005  WBC 5.3 6.1  NEUTROABS 2,438 3,373  HGB 13.4 14.2  HCT 42.1 45.1  MCV 80.8 80.4  PLT 211 222   Lipid Panel: Recent Labs    12/14/19 0804 03/13/20 1026 10/09/20 1005  CHOL 159 160 163  HDL 56 66 64  LDLCALC 84 76 79  TRIG 99 96 114  CHOLHDL 2.8 2.4 2.5   Lab Results  Component Value Date   HGBA1C 5.8 (H) 10/09/2020    Procedures since last visit: No results found.  Assessment/Plan  1. Urinary frequency  - POC Urinalysis Dipstick - Urine Culture - ciprofloxacin (CIPRO) 500 MG tablet; Take 1 tablet (500 mg total) by mouth 2 (two) times daily for 5 days.  Dispense: 10 tablet; Refill: 0    Labs/tests ordered:  Urine Culture  Next appt:  04/11/2021

## 2020-10-22 LAB — URINE CULTURE
MICRO NUMBER:: 11967997
SPECIMEN QUALITY:: ADEQUATE

## 2020-10-22 NOTE — Progress Notes (Signed)
Urine culture bacteria isolate of E. Coli was sensitive to Cipro which was prescribed during the office visit. Continue taking Cipro.

## 2020-11-07 ENCOUNTER — Other Ambulatory Visit: Payer: Self-pay | Admitting: Nurse Practitioner

## 2020-11-07 DIAGNOSIS — G5691 Unspecified mononeuropathy of right upper limb: Secondary | ICD-10-CM

## 2020-12-28 ENCOUNTER — Other Ambulatory Visit: Payer: Self-pay | Admitting: Nurse Practitioner

## 2020-12-28 DIAGNOSIS — I10 Essential (primary) hypertension: Secondary | ICD-10-CM

## 2021-02-08 ENCOUNTER — Encounter: Payer: Medicare PPO | Admitting: Nurse Practitioner

## 2021-02-08 ENCOUNTER — Other Ambulatory Visit: Payer: Self-pay

## 2021-02-27 ENCOUNTER — Telehealth: Payer: Self-pay

## 2021-02-27 ENCOUNTER — Ambulatory Visit (INDEPENDENT_AMBULATORY_CARE_PROVIDER_SITE_OTHER): Payer: Medicare PPO | Admitting: Nurse Practitioner

## 2021-02-27 ENCOUNTER — Encounter: Payer: Self-pay | Admitting: Nurse Practitioner

## 2021-02-27 ENCOUNTER — Other Ambulatory Visit: Payer: Self-pay

## 2021-02-27 DIAGNOSIS — Z0000 Encounter for general adult medical examination without abnormal findings: Secondary | ICD-10-CM

## 2021-02-27 NOTE — Patient Instructions (Signed)
[name] , Thank you for taking time to come for your Medicare Wellness Visit. I appreciate your ongoing commitment to your health goals. Please review the following plan we discussed and let me know if I can assist you in the future.   Screening recommendations/referrals: Colonoscopy up to date Recommended yearly ophthalmology/optometry visit for glaucoma screening and checkup Recommended yearly dental visit for hygiene and checkup  Vaccinations: Influenza vaccine RECOMMENDED at this time.  Pneumococcal vaccine up to date Tdap vaccine up to date Shingles vaccine up to date    Advanced directives: recommended to complete and bring to office so we can place on file.   Conditions/risks identified: advance age.   Next appointment: 1 year  Preventive Care 16 Years and Older, Male Preventive care refers to lifestyle choices and visits with your health care provider that can promote health and wellness. What does preventive care include? A yearly physical exam. This is also called an annual well check. Dental exams once or twice a year. Routine eye exams. Ask your health care provider how often you should have your eyes checked. Personal lifestyle choices, including: Daily care of your teeth and gums. Regular physical activity. Eating a healthy diet. Avoiding tobacco and drug use. Limiting alcohol use. Practicing safe sex. Taking low doses of aspirin every day. Taking vitamin and mineral supplements as recommended by your health care provider. What happens during an annual well check? The services and screenings done by your health care provider during your annual well check will depend on your age, overall health, lifestyle risk factors, and family history of disease. Counseling  Your health care provider may ask you questions about your: Alcohol use. Tobacco use. Drug use. Emotional well-being. Home and relationship well-being. Sexual activity. Eating habits. History of  falls. Memory and ability to understand (cognition). Work and work Astronomer. Screening  You may have the following tests or measurements: Height, weight, and BMI. Blood pressure. Lipid and cholesterol levels. These may be checked every 5 years, or more frequently if you are over 68 years old. Skin check. Lung cancer screening. You may have this screening every year starting at age 55 if you have a 30-pack-year history of smoking and currently smoke or have quit within the past 15 years. Fecal occult blood test (FOBT) of the stool. You may have this test every year starting at age 38. Flexible sigmoidoscopy or colonoscopy. You may have a sigmoidoscopy every 5 years or a colonoscopy every 10 years starting at age 64. Prostate cancer screening. Recommendations will vary depending on your family history and other risks. Hepatitis C blood test. Hepatitis B blood test. Sexually transmitted disease (STD) testing. Diabetes screening. This is done by checking your blood sugar (glucose) after you have not eaten for a while (fasting). You may have this done every 1-3 years. Abdominal aortic aneurysm (AAA) screening. You may need this if you are a current or former smoker. Osteoporosis. You may be screened starting at age 68 if you are at high risk. Talk with your health care provider about your test results, treatment options, and if necessary, the need for more tests. Vaccines  Your health care provider may recommend certain vaccines, such as: Influenza vaccine. This is recommended every year. Tetanus, diphtheria, and acellular pertussis (Tdap, Td) vaccine. You may need a Td booster every 10 years. Zoster vaccine. You may need this after age 93. Pneumococcal 13-valent conjugate (PCV13) vaccine. One dose is recommended after age 61. Pneumococcal polysaccharide (PPSV23) vaccine. One dose is  recommended after age 78. Talk to your health care provider about which screenings and vaccines you need and  how often you need them. This information is not intended to replace advice given to you by your health care provider. Make sure you discuss any questions you have with your health care provider. Document Released: 06/02/2015 Document Revised: 01/24/2016 Document Reviewed: 03/07/2015 Elsevier Interactive Patient Education  2017 [place] Prevention in the Home Falls can cause injuries. They can happen to people of all ages. There are many things you can do to make your home safe and to help prevent falls. What can I do on the outside of my home? Regularly fix the edges of walkways and driveways and fix any cracks. Remove anything that might make you trip as you walk through a door, such as a raised step or threshold. Trim any bushes or trees on the path to your home. Use bright outdoor lighting. Clear any walking paths of anything that might make someone trip, such as rocks or tools. Regularly check to see if handrails are loose or broken. Make sure that both sides of any steps have handrails. Any raised decks and porches should have guardrails on the edges. Have any leaves, snow, or ice cleared regularly. Use sand or salt on walking paths during winter. Clean up any spills in your garage right away. This includes oil or grease spills. What can I do in the bathroom? Use night lights. Install grab bars by the toilet and in the tub and shower. Do not use towel bars as grab bars. Use non-skid mats or decals in the tub or shower. If you need to sit down in the shower, use a plastic, non-slip stool. Keep the floor dry. Clean up any water that spills on the floor as soon as it happens. Remove soap buildup in the tub or shower regularly. Attach bath mats securely with double-sided non-slip rug tape. Do not have throw rugs and other things on the floor that can make you trip. What can I do in the bedroom? Use night lights. Make sure that you have a light by your bed that is easy to  reach. Do not use any sheets or blankets that are too big for your bed. They should not hang down onto the floor. Have a firm chair that has side arms. You can use this for support while you get dressed. Do not have throw rugs and other things on the floor that can make you trip. What can I do in the kitchen? Clean up any spills right away. Avoid walking on wet floors. Keep items that you use a lot in easy-to-reach places. If you need to reach something above you, use a strong step stool that has a grab bar. Keep electrical cords out of the way. Do not use floor polish or wax that makes floors slippery. If you must use wax, use non-skid floor wax. Do not have throw rugs and other things on the floor that can make you trip. What can I do with my stairs? Do not leave any items on the stairs. Make sure that there are handrails on both sides of the stairs and use them. Fix handrails that are broken or loose. Make sure that handrails are as long as the stairways. Check any carpeting to make sure that it is firmly attached to the stairs. Fix any carpet that is loose or worn. Avoid having throw rugs at the top or bottom of the stairs. If  you do have throw rugs, attach them to the floor with carpet tape. Make sure that you have a light switch at the top of the stairs and the bottom of the stairs. If you do not have them, ask someone to add them for you. What else can I do to help prevent falls? Wear shoes that: Do not have high heels. Have rubber bottoms. Are comfortable and fit you well. Are closed at the toe. Do not wear sandals. If you use a stepladder: Make sure that it is fully opened. Do not climb a closed stepladder. Make sure that both sides of the stepladder are locked into place. Ask someone to hold it for you, if possible. Clearly mark and make sure that you can see: Any grab bars or handrails. First and last steps. Where the edge of each step is. Use tools that help you move  around (mobility aids) if they are needed. These include: Canes. Walkers. Scooters. Crutches. Turn on the lights when you go into a dark area. Replace any light bulbs as soon as they burn out. Set up your furniture so you have a clear path. Avoid moving your furniture around. If any of your floors are uneven, fix them. If there are any pets around you, be aware of where they are. Review your medicines with your doctor. Some medicines can make you feel dizzy. This can increase your chance of falling. Ask your doctor what other things that you can do to help prevent falls. This information is not intended to replace advice given to you by your health care provider. Make sure you discuss any questions you have with your health care provider. Document Released: 03/02/2009 Document Revised: 10/12/2015 Document Reviewed: 06/10/2014 Elsevier Interactive Patient Education  2017 [employer].

## 2021-02-27 NOTE — Telephone Encounter (Signed)
Randall James, Randall James are scheduled for a virtual visit with your provider today.    Just as we do with appointments in the office, we must obtain your consent to participate.  Your consent will be active for this visit and any virtual visit you may have with one of our providers in the next 365 days.    If you have a MyChart account, I can also send a copy of this consent to you electronically.  All virtual visits are billed to your insurance company just like a traditional visit in the office.  As this is a virtual visit, video technology does not allow for your provider to perform a traditional examination.  This may limit your provider's ability to fully assess your condition.  If your provider identifies any concerns that need to be evaluated in person or the need to arrange testing such as labs, EKG, etc, we will make arrangements to do so.    Although advances in technology are sophisticated, we cannot ensure that it will always work on either your end or our end.  If the connection with a video visit is poor, we may have to switch to a telephone visit.  With either a video or telephone visit, we are not always able to ensure that we have a secure connection.   I need to obtain your verbal consent now.   Are you willing to proceed with your visit today?   Randall James has provided verbal consent on 02/27/2021 for a virtual visit (video or telephone).   Elveria Royals, CMA 02/27/2021  1:33 PM

## 2021-02-27 NOTE — Progress Notes (Signed)
Subjective:   Randall James is a 70 y.o. male who presents for Medicare Annual/Subsequent preventive examination.  Review of Systems     Cardiac Risk Factors include: advanced age (>61men, >52 women);hypertension;dyslipidemia;family history of premature cardiovascular disease     Objective:    There were no vitals filed for this visit. There is no height or weight on file to calculate BMI.  Advanced Directives 02/27/2021 10/20/2020 10/09/2020 03/13/2020 09/13/2019 05/17/2019 03/12/2019  Does Patient Have a Medical Advance Directive? No No No No No No No  Does patient want to make changes to medical advance directive? - - - - - - -  Would patient like information on creating a medical advance directive? - No - Patient declined No - Patient declined Yes (MAU/Ambulatory/Procedural Areas - Information given) - Yes (MAU/Ambulatory/Procedural Areas - Information given) No - Patient declined    Current Medications (verified) Outpatient Encounter Medications as of 02/27/2021  Medication Sig   amLODipine (NORVASC) 10 MG tablet TAKE 1 TABLET(10 MG) BY MOUTH DAILY   aspirin 325 MG EC tablet Take 325 mg by mouth daily.   Cetirizine HCl (ZYRTEC PO) Take by mouth.   Coenzyme Q10 100 MG capsule Take 2 capsules (200 mg total) by mouth daily.   gabapentin (NEURONTIN) 300 MG capsule TAKE ONE CAPSULE BY MOUTH AT BEDTIME   rosuvastatin (CRESTOR) 20 MG tablet TAKE 1 TABLET(20 MG) BY MOUTH DAILY   No facility-administered encounter medications on file as of 02/27/2021.    Allergies (verified) Patient has no known allergies.   History: Past Medical History:  Diagnosis Date   Allergy    Arthritis    hand   Chronic kidney disease    kidney stone "long time ago"   Hyperlipidemia    Hypertension    Stroke [facility]) 2019   Sep 22, 2017   Past Surgical History:  Procedure Laterality Date   sarcidosis surgery- 1984     pt states he isn't sure what was involved with this, possible lymph nodes  removed   Family History  Problem Relation Age of Onset   Pancreatic cancer Mother    Cancer Neg Hx    Hypertension Neg Hx    Heart disease Neg Hx    Colon cancer Neg Hx    Esophageal cancer Neg Hx    Rectal cancer Neg Hx    Stomach cancer Neg Hx    Social History   Socioeconomic History   Marital status: Married    Spouse name: Not on file   Number of children: Not on file   Years of education: Not on file   Highest education level: Not on file  Occupational History   Not on file  Tobacco Use   Smoking status: Former   Smokeless tobacco: Former    Types: Chew   Tobacco comments:    no longer using chew since CVA in 2019  Vaping Use   Vaping Use: Never used  Substance and Sexual Activity   Alcohol use: Not Currently   Drug use: Not Currently    Comment: "in college"   Sexual activity: Not Currently  Other Topics Concern   Not on file  Social History Narrative   Social History      Diet? none      Do you drink/eat things with caffeine? Coffee- yes      Marital status?              married  What year were you married? 08/08/1973      Do you live in a house, apartment, assisted living, condo, trailer, etc.? house      Is it one or more stories? 2 stories      How many persons live in your home? 2      Do you have any pets in your home? (please list) none      Highest level of education completed? BS in Education and Social Studies      Current or past profession: school teacher      Do you exercise?         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 no      Do you have a DNR form?                                  If not, do you want to discuss one? no      Do you have signed POA/HPOA for forms? no      Functional Status      Do you have difficulty bathing or dressing yourself? yes      Do  you have difficulty preparing food or eating? yes      Do you have difficulty managing your medications? yes      Do you have difficulty managing your finances? no      Do you have difficulty affording your medications? No    Social Determinants of Corporate investment banker Strain: Not on file  Food Insecurity: Not on file  Transportation Needs: Not on file  Physical Activity: Not on file  Stress: Not on file  Social Connections: Not on file    Tobacco Counseling Counseling given: Not Answered Tobacco comments: no longer using chew since CVA in 2019   Clinical Intake:  Pre-visit preparation completed: Yes  Pain : No/denies pain     BMI - recorded: 24  How often do you need to have someone help you when you read instructions, pamphlets, or other written materials from your doctor or pharmacy?: 1 - Never  Diabetic?no         Activities of Daily Living In your present state of health, do you have any difficulty performing the following activities: 02/27/2021  Hearing? Y  Vision? N  Difficulty concentrating or making decisions? N  Walking or climbing stairs? N  Dressing or bathing? N  Doing errands, shopping? N  Preparing Food and eating ? N  Using the Toilet? N  In the past six months, have you accidently leaked urine? N  Do you have problems with loss of bowel control? N  Managing your Medications? N  Managing your Finances? N  Housekeeping or managing your Housekeeping? N  Some recent data might be hidden    Patient Care Team: Sharon Seller, NP as PCP - General (Geriatric Medicine)  Indicate any recent Medical Services you may have received from other than [facility] providers in the past year (date may be approximate).     Assessment:   This is a routine wellness examination for Randall James.  Hearing/Vision screen  Hearing Screening - Comments:: Patient has problems with hearing. Patient doesn't wear hearing aids.  Vision Screening - Comments::  Patient has no vision problems. Patient had eye exam within past year. Sees Dr. Nani Skillern  Dietary issues and exercise activities discussed: Current Exercise Habits: Home exercise routine, Type of exercise: walking, Time (Minutes): 60, Frequency (Times/Week): 6, Weekly Exercise (Minutes/Week): 360   Goals Addressed   None    Depression Screen PHQ 2/9 Scores 02/27/2021 03/13/2020 05/17/2019 09/30/2018 05/11/2018 10/24/2017  PHQ - 2 Score 0 0 0 0 0 0    Fall Risk Fall Risk  02/27/2021 10/20/2020 10/09/2020 03/13/2020 09/13/2019  Falls in the past year? 0 0 0 0 0  Number falls in past yr: 0 0 0 0 0  Injury with Fall? 0 0 0 0 0  Risk for fall due to : No Fall Risks - - - -  Follow up Falls evaluation completed - - - -    FALL RISK PREVENTION PERTAINING TO THE HOME:  Any stairs in or around the home? Yes  If so, are there any without handrails? No  Home free of loose throw rugs in walkways, pet beds, electrical cords, etc? Yes  Adequate lighting in your home to reduce risk of falls? Yes   ASSISTIVE DEVICES UTILIZED TO PREVENT FALLS:  Life alert? No  Use of a cane, walker or w/c? No  Grab bars in the bathroom? No  Shower chair or bench in shower? No  Elevated toilet seat or a handicapped toilet? No   TIMED UP AND GO:  Was the test performed? No .    Cognitive Function: MMSE - Mini Mental State Exam 05/11/2018  Orientation to time 5  Orientation to Place 3  Registration 3  Attention/ Calculation 5  Recall 2  Language- name 2 objects 2  Language- repeat 1  Language- follow 3 step command 3  Language- read & follow direction 1  Write a sentence 1  Copy design 0  Total score 26     6CIT Screen 02/27/2021 05/17/2019  What Year? 0 points 0 points  What month? 0 points 0 points  What time? 0 points 0 points  Count back from 20 0 points 0 points  Months in reverse 0 points 0 points  Repeat phrase 0 points 0 points  Total Score 0 0    Immunizations Immunization History   Administered Date(s) Administered   Fluad Quad(high Dose 65+) 03/13/2020   Influenza,inj,Quad PF,6+ Mos 01/28/2018, 02/25/2019   Influenza-Unspecified 05/20/2017   Moderna Sars-Covid-2 Vaccination 05/26/2019, 06/24/2019, 04/05/2020, 08/29/2020   Pneumococcal Conjugate-13 05/11/2018   Pneumococcal Polysaccharide-23 09/13/2019   Tdap 04/10/2019   Zoster Recombinat (Shingrix) 04/10/2019    TDAP status: Due, Education has been provided regarding the importance of this vaccine. Advised may receive this vaccine at local pharmacy or Health Dept. Aware to provide a copy of the vaccination record if obtained from local pharmacy or Health Dept. Verbalized acceptance and understanding.  Flu Vaccine status: Due, Education has been provided regarding the importance of this vaccine. Advised may receive this vaccine at local pharmacy or Health Dept. Aware to provide a copy of the vaccination record if obtained from local pharmacy or Health Dept. Verbalized acceptance and understanding.  Pneumococcal vaccine status: Up to date  Covid-19 vaccine status: Completed vaccines  Qualifies for Shingles Vaccine? Yes   Zostavax completed No   Shingrix Completed?: No.    Education has been provided regarding the importance of this vaccine. Patient has been  advised to call insurance company to determine out of pocket expense if they have not yet received this vaccine. Advised may also receive vaccine at local pharmacy or Health Dept. Verbalized acceptance and understanding.  Screening Tests Health Maintenance  Topic Date Due   Zoster Vaccines- Shingrix (2 of 2) 06/05/2019   INFLUENZA VACCINE  12/18/2020   Fecal DNA (Cologuard)  09/15/2022   TETANUS/TDAP  04/09/2029   COVID-19 Vaccine  Completed   HPV VACCINES  Aged Out   Hepatitis C Screening  Discontinued    Health Maintenance  Health Maintenance Due  Topic Date Due   Zoster Vaccines- Shingrix (2 of 2) 06/05/2019   INFLUENZA VACCINE  12/18/2020     Colorectal cancer screening: Type of screening: Cologuard. Completed 2021. Repeat every 3 years  Lung Cancer Screening: (Low Dose CT Chest recommended if Age 53-80 years, 30 pack-year currently smoking OR have quit w/in 15years.) does not qualify.   Lung Cancer Screening Referral: na  Additional Screening:  Hepatitis C Screening: does qualify; Completed   Vision Screening: Recommended annual ophthalmology exams for early detection of glaucoma and other disorders of the eye. Is the patient up to date with their annual eye exam?  Yes  Who is the provider or what is the name of the office in which the patient attends annual eye exams? [facility] If pt is not established with a provider, would they like to be referred to a provider to establish care? No .   Dental Screening: Recommended annual dental exams for proper oral hygiene  Community Resource Referral / Chronic Care Management: CRR required this visit?  No   CCM required this visit?  No      Plan:     I have personally reviewed and noted the following in the patient's chart:   Medical and social history Use of alcohol, tobacco or illicit drugs  Current medications and supplements including opioid prescriptions. Patient is not currently taking opioid prescriptions. Functional ability and status Nutritional status Physical activity Advanced directives List of other physicians Hospitalizations, surgeries, and ER visits in previous 12 months Vitals Screenings to include cognitive, depression, and falls Referrals and appointments  In addition, I have reviewed and discussed with patient certain preventive protocols, quality metrics, and best practice recommendations. A written personalized care plan for preventive services as well as general preventive health recommendations were provided to patient.     Sharon Seller, NP   02/27/2021    Virtual Visit via Telephone Note  I connected withNAME@ on 02/27/21 at  1:00  PM EDT by telephone and verified that I am speaking with the correct person using two identifiers.  Location: Patient: home Provider: [facility]    I discussed the limitations, risks, security and privacy concerns of performing an evaluation and management service by telephone and the availability of in person appointments. I also discussed with the patient that there may be a patient responsible charge related to this service. The patient expressed understanding and agreed to proceed.   I discussed the assessment and treatment plan with the patient. The patient was provided an opportunity to ask questions and all were answered. The patient agreed with the plan and demonstrated an understanding of the instructions.   The patient was advised to call back or seek an in-person evaluation if the symptoms worsen or if the condition fails to improve as anticipated.  I provided 15 minutes of non-face-to-face time during this encounter.  Janene Harvey. Janyth Contes, AGNP Avs printed and  mailed

## 2021-02-27 NOTE — Progress Notes (Signed)
This service is provided via telemedicine  No vital signs collected/recorded due to the encounter was a telemedicine visit.   Location of patient (ex: home, work):  Home  Patient consents to a telephone visit:  Yes, see encounter dated 02/27/2021  Location of the provider (ex: office, home):  [facility]  Name of any referring provider:  N/A  Names of all persons participating in the telemedicine service and their role in the encounter:  Abbey Chatters, Nurse Practitioner, Elveria Royals, CMA, and patient.   Time spent on call:  14 minutes with medical assistant

## 2021-03-26 ENCOUNTER — Encounter: Payer: Self-pay | Admitting: Nurse Practitioner

## 2021-04-09 ENCOUNTER — Other Ambulatory Visit: Payer: Medicare PPO

## 2021-04-09 ENCOUNTER — Ambulatory Visit: Payer: Medicare PPO | Admitting: Nurse Practitioner

## 2021-04-09 ENCOUNTER — Other Ambulatory Visit: Payer: Self-pay

## 2021-04-09 ENCOUNTER — Encounter: Payer: Self-pay | Admitting: Nurse Practitioner

## 2021-04-09 VITALS — BP 122/76 | HR 58 | Temp 97.8°F | Ht 68.0 in | Wt 162.0 lb

## 2021-04-09 DIAGNOSIS — I10 Essential (primary) hypertension: Secondary | ICD-10-CM

## 2021-04-09 DIAGNOSIS — R7303 Prediabetes: Secondary | ICD-10-CM

## 2021-04-09 DIAGNOSIS — E782 Mixed hyperlipidemia: Secondary | ICD-10-CM

## 2021-04-09 DIAGNOSIS — I69322 Dysarthria following cerebral infarction: Secondary | ICD-10-CM

## 2021-04-09 DIAGNOSIS — G5691 Unspecified mononeuropathy of right upper limb: Secondary | ICD-10-CM

## 2021-04-09 NOTE — Progress Notes (Signed)
Careteam: Patient Care Team: Lauree Chandler, NP as PCP - General (Geriatric Medicine)  PLACE OF SERVICE:  [place] Directive information Does Patient Have a Medical Advance Directive?: No, Would patient like information on creating a medical advance directive?: No - Patient declined  No Known Allergies  Chief Complaint  Patient presents with   Medical Management of Chronic Issues    6 month follow-up.     HPI: Patient is a 70 y.o. male for routine follow up.  Reports he is doing well. No concerns today.   Neuropathy is stable.   No abnormal aches or pains  Plays golf routinely.   Review of Systems:  Review of Systems  Constitutional:  Negative for chills, fever and weight loss.  HENT:  Negative for tinnitus.   Respiratory:  Negative for cough, sputum production and shortness of breath.   Cardiovascular:  Negative for chest pain, palpitations and leg swelling.  Gastrointestinal:  Negative for abdominal pain, constipation, diarrhea and heartburn.  Genitourinary:  Negative for dysuria, frequency and urgency.  Musculoskeletal:  Negative for back pain, falls, joint pain and myalgias.  Skin: Negative.   Neurological:  Negative for dizziness and headaches.  Psychiatric/Behavioral:  Negative for depression and memory loss. The patient does not have insomnia.    Past Medical History:  Diagnosis Date   Allergy    Arthritis    hand   Chronic kidney disease    kidney stone "long time ago"   Hyperlipidemia    Hypertension    Stroke [facility]) 2019   Sep 22, 2017   Past Surgical History:  Procedure Laterality Date   sarcidosis surgery- 1984     pt states he isn't sure what was involved with this, possible lymph nodes removed   Social History:   reports that he has never smoked. He quit smokeless tobacco use about 3 years ago.  His smokeless tobacco use included chew. He reports that he does not currently use alcohol. He reports that he does not currently  use drugs.  Family History  Problem Relation Age of Onset   Pancreatic cancer Mother    Cancer Neg Hx    Hypertension Neg Hx    Heart disease Neg Hx    Colon cancer Neg Hx    Esophageal cancer Neg Hx    Rectal cancer Neg Hx    Stomach cancer Neg Hx     Medications: Patient's Medications  New Prescriptions   No medications on file  Previous Medications   AMLODIPINE (NORVASC) 10 MG TABLET    TAKE 1 TABLET(10 MG) BY MOUTH DAILY   ASPIRIN 325 MG EC TABLET    Take 325 mg by mouth daily.   CETIRIZINE HCL (ZYRTEC PO)    Take by mouth.   COENZYME Q10 100 MG CAPSULE    Take 2 capsules (200 mg total) by mouth daily.   GABAPENTIN (NEURONTIN) 300 MG CAPSULE    TAKE ONE CAPSULE BY MOUTH AT BEDTIME   ROSUVASTATIN (CRESTOR) 20 MG TABLET    TAKE 1 TABLET(20 MG) BY MOUTH DAILY  Modified Medications   No medications on file  Discontinued Medications   No medications on file    Physical Exam:  Vitals:   04/09/21 0933  BP: 122/76  Pulse: (!) 58  Temp: 97.8 F (36.6 C)  TempSrc: Temporal  SpO2: 98%  Weight: 162 lb (73.5 kg)  Height: 5' 8" (1.727 m)   Body mass index is 24.63 kg/m. Wt Readings  from Last 3 Encounters:  04/09/21 162 lb (73.5 kg)  10/20/20 160 lb (72.6 kg)  10/09/20 161 lb 12.8 oz (73.4 kg)    Physical Exam Constitutional:      General: He is not in acute distress.    Appearance: He is well-developed. He is not diaphoretic.  HENT:     Head: Normocephalic and atraumatic.     Right Ear: External ear normal.     Left Ear: External ear normal.     Mouth/Throat:     Pharynx: No oropharyngeal exudate.  Eyes:     Conjunctiva/sclera: Conjunctivae normal.     Pupils: Pupils are equal, round, and reactive to light.  Cardiovascular:     Rate and Rhythm: Normal rate and regular rhythm.     Heart sounds: Normal heart sounds.  Pulmonary:     Effort: Pulmonary effort is normal.     Breath sounds: Normal breath sounds.  Abdominal:     General: Bowel sounds are normal.      Palpations: Abdomen is soft.  Musculoskeletal:        General: No tenderness.     Cervical back: Normal range of motion and neck supple.     Right lower leg: No edema.     Left lower leg: No edema.  Skin:    General: Skin is warm and dry.  Neurological:     Mental Status: He is alert and oriented to person, place, and time.    Labs reviewed: Basic Metabolic Panel: Recent Labs    10/09/20 1005  NA 140  K 4.7  CL 105  CO2 28  GLUCOSE 83  BUN 8  CREATININE 1.38*  CALCIUM 9.7   Liver Function Tests: Recent Labs    10/09/20 1005  AST 13  ALT 14  BILITOT 0.6  PROT 7.9   No results for input(s): LIPASE, AMYLASE in the last 8760 hours. No results for input(s): AMMONIA in the last 8760 hours. CBC: Recent Labs    10/09/20 1005  WBC 6.1  NEUTROABS 3,373  HGB 14.2  HCT 45.1  MCV 80.4  PLT 222   Lipid Panel: Recent Labs    10/09/20 1005  CHOL 163  HDL 64  LDLCALC 79  TRIG 114  CHOLHDL 2.5   TSH: No results for input(s): TSH in the last 8760 hours. A1C: Lab Results  Component Value Date   HGBA1C 5.8 (H) 10/09/2020     Assessment/Plan 1. Essential hypertension -Blood pressure well controlled Continue current medications Recheck metabolic panel - CBC with Differential/Platelet - CMP with eGFR([facility])  2. Mixed hyperlipidemia -continues on crestor with dietary modifications.  - Lipid panel  3. Neuropathy, upper extremity, right Stable on gabapentin.   4. Prediabetes -continue with dietary modifications.  - Hemoglobin A1c  5. 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    Next appt: 6 months, labs prior  Jessica K. [place], [facility] [phone]

## 2021-04-10 LAB — CBC WITH DIFFERENTIAL/PLATELET
Absolute Monocytes: 609 cells/uL (ref 200–950)
Basophils Absolute: 29 cells/uL (ref 0–200)
Basophils Relative: 0.5 %
Eosinophils Absolute: 220 cells/uL (ref 15–500)
Eosinophils Relative: 3.8 %
HCT: 41 % (ref 38.5–50.0)
Hemoglobin: 13.2 g/dL (ref 13.2–17.1)
Lymphs Abs: 2390 cells/uL (ref 850–3900)
MCH: 26 pg — ABNORMAL LOW (ref 27.0–33.0)
MCHC: 32.2 g/dL (ref 32.0–36.0)
MCV: 80.7 fL (ref 80.0–100.0)
MPV: 11.8 fL (ref 7.5–12.5)
Monocytes Relative: 10.5 %
Neutro Abs: 2552 cells/uL (ref 1500–7800)
Neutrophils Relative %: 44 %
Platelets: 228 10*3/uL (ref 140–400)
RBC: 5.08 10*6/uL (ref 4.20–5.80)
RDW: 14.4 % (ref 11.0–15.0)
Total Lymphocyte: 41.2 %
WBC: 5.8 10*3/uL (ref 3.8–10.8)

## 2021-04-10 LAB — COMPLETE METABOLIC PANEL WITH GFR
AG Ratio: 1.3 (calc) (ref 1.0–2.5)
ALT: 12 U/L (ref 9–46)
AST: 16 U/L (ref 10–35)
Albumin: 4.3 g/dL (ref 3.6–5.1)
Alkaline phosphatase (APISO): 70 U/L (ref 35–144)
BUN: 8 mg/dL (ref 7–25)
CO2: 28 mmol/L (ref 20–32)
Calcium: 9.6 mg/dL (ref 8.6–10.3)
Chloride: 106 mmol/L (ref 98–110)
Creat: 1.27 mg/dL (ref 0.70–1.28)
Globulin: 3.2 g/dL (calc) (ref 1.9–3.7)
Glucose, Bld: 78 mg/dL (ref 65–99)
Potassium: 4.5 mmol/L (ref 3.5–5.3)
Sodium: 142 mmol/L (ref 135–146)
Total Bilirubin: 0.6 mg/dL (ref 0.2–1.2)
Total Protein: 7.5 g/dL (ref 6.1–8.1)
eGFR: 60 mL/min/{1.73_m2} (ref >=60)

## 2021-04-10 LAB — LIPID PANEL
Cholesterol: 164 mg/dL (ref <200)
HDL: 64 mg/dL (ref >=40)
LDL Cholesterol (Calc): 81 mg/dL (calc)
Non-HDL Cholesterol (Calc): 100 mg/dL (calc) (ref <130)
Total CHOL/HDL Ratio: 2.6 (calc) (ref <5.0)
Triglycerides: 97 mg/dL (ref <150)

## 2021-04-10 LAB — HEMOGLOBIN A1C
Hgb A1c MFr Bld: 5.9 % of total Hgb — ABNORMAL HIGH (ref <5.7)
Mean Plasma Glucose: 123 mg/dL
eAG (mmol/L): 6.8 mmol/L

## 2021-04-11 ENCOUNTER — Other Ambulatory Visit: Payer: Medicare PPO

## 2021-04-11 ENCOUNTER — Ambulatory Visit: Payer: Medicare PPO | Admitting: Nurse Practitioner

## 2021-05-19 ENCOUNTER — Other Ambulatory Visit: Payer: Self-pay | Admitting: Nurse Practitioner

## 2021-05-19 DIAGNOSIS — G5691 Unspecified mononeuropathy of right upper limb: Secondary | ICD-10-CM

## 2021-05-23 ENCOUNTER — Encounter: Payer: Self-pay | Admitting: Nurse Practitioner

## 2021-05-23 ENCOUNTER — Other Ambulatory Visit: Payer: Self-pay | Admitting: Nurse Practitioner

## 2021-05-23 DIAGNOSIS — G5691 Unspecified mononeuropathy of right upper limb: Secondary | ICD-10-CM

## 2021-05-23 MED ORDER — GABAPENTIN 300 MG PO CAPS: ORAL_CAPSULE | ORAL | 3 refills | Status: DC

## 2021-05-23 NOTE — Telephone Encounter (Signed)
Last seen 03/2021 by Areatha Keas, NP 10/19/2021 Next apnt 05/22/21 1058 Sign Maurice Small, CMA Reorder from Order: [id number]; Ordering Mode: Rx Refill  Reordered already.

## 2021-05-23 NOTE — Telephone Encounter (Signed)
Re sent Rx due to Class set to Print.

## 2021-06-23 ENCOUNTER — Other Ambulatory Visit: Payer: Self-pay | Admitting: Nurse Practitioner

## 2021-06-23 DIAGNOSIS — I10 Essential (primary) hypertension: Secondary | ICD-10-CM

## 2021-08-26 ENCOUNTER — Other Ambulatory Visit: Payer: Self-pay | Admitting: Nurse Practitioner

## 2021-08-26 DIAGNOSIS — E782 Mixed hyperlipidemia: Secondary | ICD-10-CM

## 2021-10-19 ENCOUNTER — Ambulatory Visit: Payer: Medicare PPO | Admitting: Nurse Practitioner

## 2021-11-02 NOTE — Patient Instructions (Signed)
Please contact your local pharmacy, previous provider, or insurance carrier for vaccine/immunization records. Ensure that any procedures done outside of [facility] are faxed to us [phone] or you can sign release of records form at the front desk to keep your medical record updated.   ?

## 2021-11-05 ENCOUNTER — Ambulatory Visit: Payer: Medicare PPO | Admitting: Nurse Practitioner

## 2021-11-05 ENCOUNTER — Encounter: Payer: Self-pay | Admitting: Nurse Practitioner

## 2021-11-05 VITALS — BP 142/90 | HR 65 | Temp 97.4°F | Resp 16 | Ht 68.0 in | Wt 160.8 lb

## 2021-11-05 DIAGNOSIS — G5691 Unspecified mononeuropathy of right upper limb: Secondary | ICD-10-CM

## 2021-11-05 DIAGNOSIS — E782 Mixed hyperlipidemia: Secondary | ICD-10-CM

## 2021-11-05 DIAGNOSIS — N401 Enlarged prostate with lower urinary tract symptoms: Secondary | ICD-10-CM

## 2021-11-05 DIAGNOSIS — R7303 Prediabetes: Secondary | ICD-10-CM | POA: Diagnosis not present

## 2021-11-05 DIAGNOSIS — I10 Essential (primary) hypertension: Secondary | ICD-10-CM | POA: Diagnosis not present

## 2021-11-05 DIAGNOSIS — I69322 Dysarthria following cerebral infarction: Secondary | ICD-10-CM

## 2021-11-05 DIAGNOSIS — R350 Frequency of micturition: Secondary | ICD-10-CM

## 2021-11-05 NOTE — Progress Notes (Signed)
Careteam: Patient Care Team: Lauree Chandler, NP as PCP - General (Geriatric Medicine)  PLACE OF SERVICE:  [place] Directive information    No Known Allergies  Chief Complaint  Patient presents with   Medical Management of Chronic Issues    6 month follow up & Fasting Labs.   Immunizations    Discuss the need for [employer].     HPI: Patient is a 71 y.o. male for routine follow up.   Blood pressure elevated this am but has not taken his medication because he is fasting.   Neuropathy is stable. No worsening of symptoms, continues on gabapentin.   BPH with urinary frequency- stable, will get up 1-2 times at night.  No changes in frequency or flow.   No pain. Doing well.  Continues to play golf regularly.   Review of Systems:  Review of Systems  Constitutional:  Negative for chills, fever and weight loss.  HENT:  Negative for tinnitus.   Respiratory:  Negative for cough, sputum production and shortness of breath.   Cardiovascular:  Negative for chest pain, palpitations and leg swelling.  Gastrointestinal:  Negative for abdominal pain, constipation, diarrhea and heartburn.  Genitourinary:  Negative for dysuria, frequency and urgency.  Musculoskeletal:  Negative for back pain, falls, joint pain and myalgias.  Skin: Negative.   Neurological:  Negative for dizziness and headaches.  Psychiatric/Behavioral:  Negative for depression and memory loss. The patient does not have insomnia.     Past Medical History:  Diagnosis Date   Allergy    Arthritis    hand   Chronic kidney disease    kidney stone "long time ago"   Hyperlipidemia    Hypertension    Stroke [facility]) 2019   Sep 22, 2017   Past Surgical History:  Procedure Laterality Date   sarcidosis surgery- 1984     pt states he isn't sure what was involved with this, possible lymph nodes removed   Social History:   reports that he has never smoked. He quit smokeless tobacco use about 4 years  ago.  His smokeless tobacco use included chew. He reports that he does not currently use alcohol. He reports that he does not currently use drugs.  Family History  Problem Relation Age of Onset   Pancreatic cancer Mother    Cancer Neg Hx    Hypertension Neg Hx    Heart disease Neg Hx    Colon cancer Neg Hx    Esophageal cancer Neg Hx    Rectal cancer Neg Hx    Stomach cancer Neg Hx     Medications: Patient's Medications  New Prescriptions   No medications on file  Previous Medications   AMLODIPINE (NORVASC) 10 MG TABLET    TAKE 1 TABLET(10 MG) BY MOUTH DAILY   ASPIRIN 325 MG EC TABLET    Take 325 mg by mouth daily.   CETIRIZINE HCL (ZYRTEC PO)    Take by mouth.   COENZYME Q10 100 MG CAPSULE    Take 2 capsules (200 mg total) by mouth daily.   GABAPENTIN (NEURONTIN) 300 MG CAPSULE    TAKE 1 CAPSULE BY MOUTH AT BEDTIME   ROSUVASTATIN (CRESTOR) 20 MG TABLET    TAKE 1 TABLET(20 MG) BY MOUTH DAILY  Modified Medications   No medications on file  Discontinued Medications   No medications on file    Physical Exam:  Vitals:   11/05/21 0802  BP: (!) 138/98  Pulse: 65  Resp: 16  Temp: (!) 97.4 F (36.3 C)  SpO2: 97%  Weight: 160 lb 12.8 oz (72.9 kg)  Height: 5' 8" (1.727 m)   Body mass index is 24.45 kg/m. Wt Readings from Last 3 Encounters:  11/05/21 160 lb 12.8 oz (72.9 kg)  04/09/21 162 lb (73.5 kg)  10/20/20 160 lb (72.6 kg)    Physical Exam Constitutional:      General: He is not in acute distress.    Appearance: He is well-developed. He is not diaphoretic.  HENT:     Head: Normocephalic and atraumatic.     Right Ear: External ear normal.     Left Ear: External ear normal.     Mouth/Throat:     Pharynx: No oropharyngeal exudate.  Eyes:     Conjunctiva/sclera: Conjunctivae normal.     Pupils: Pupils are equal, round, and reactive to light.  Cardiovascular:     Rate and Rhythm: Normal rate and regular rhythm.     Heart sounds: Normal heart sounds.   Pulmonary:     Effort: Pulmonary effort is normal.     Breath sounds: Normal breath sounds.  Abdominal:     General: Bowel sounds are normal.     Palpations: Abdomen is soft.  Musculoskeletal:        General: No tenderness.     Cervical back: Normal range of motion and neck supple.     Right lower leg: No edema.     Left lower leg: No edema.  Skin:    General: Skin is warm and dry.  Neurological:     Mental Status: He is alert and oriented to person, place, and time.    Labs reviewed: Basic Metabolic Panel: Recent Labs    04/09/21 0952  NA 142  K 4.5  CL 106  CO2 28  GLUCOSE 78  BUN 8  CREATININE 1.27  CALCIUM 9.6   Liver Function Tests: Recent Labs    04/09/21 0952  AST 16  ALT 12  BILITOT 0.6  PROT 7.5   No results for input(s): "LIPASE", "AMYLASE" in the last 8760 hours. No results for input(s): "AMMONIA" in the last 8760 hours. CBC: Recent Labs    04/09/21 0952  WBC 5.8  NEUTROABS 2,552  HGB 13.2  HCT 41.0  MCV 80.7  PLT 228   Lipid Panel: Recent Labs    04/09/21 0952  CHOL 164  HDL 64  LDLCALC 81  TRIG 97  CHOLHDL 2.6   TSH: No results for input(s): "TSH" in the last 8760 hours. A1C: Lab Results  Component Value Date   HGBA1C 5.9 (H) 04/09/2021     Assessment/Plan 1. Essential hypertension -elevated today but has not taken medication. Goal bp <140/90. Continue on current regimen with low sodium diet.  - CBC with Differential/Platelet - CMP with eGFR([facility])  2. Neuropathy, upper extremity, right -controlled, continues gabapentin  3. Mixed hyperlipidemia -cholesterol at goal on last labs, Continue current regimen. - CMP with eGFR([facility]) - Lipid panel  4. Prediabetes -continue dietary modifications,  - Hemoglobin A1c - CMP with eGFR([facility])  5. 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   6. Benign prostatic hyperplasia with urinary  frequency Ongoing and stable.  - PSA    Return in about 6 months (around 05/07/2022) for routine follow up- labs at appt . Carlos American. [place], [facility] [phone]

## 2021-11-06 ENCOUNTER — Encounter: Payer: Self-pay | Admitting: Nurse Practitioner

## 2021-11-06 LAB — CBC WITH DIFFERENTIAL/PLATELET
Absolute Monocytes: 585 cells/uL (ref 200–950)
Basophils Absolute: 30 cells/uL (ref 0–200)
Basophils Relative: 0.6 %
Eosinophils Absolute: 190 cells/uL (ref 15–500)
Eosinophils Relative: 3.8 %
HCT: 41.6 % (ref 38.5–50.0)
Hemoglobin: 13.1 g/dL — ABNORMAL LOW (ref 13.2–17.1)
Lymphs Abs: 2180 cells/uL (ref 850–3900)
MCH: 26.4 pg — ABNORMAL LOW (ref 27.0–33.0)
MCHC: 31.5 g/dL — ABNORMAL LOW (ref 32.0–36.0)
MCV: 83.9 fL (ref 80.0–100.0)
MPV: 12 fL (ref 7.5–12.5)
Monocytes Relative: 11.7 %
Neutro Abs: 2015 cells/uL (ref 1500–7800)
Neutrophils Relative %: 40.3 %
Platelets: 196 10*3/uL (ref 140–400)
RBC: 4.96 10*6/uL (ref 4.20–5.80)
RDW: 13.6 % (ref 11.0–15.0)
Total Lymphocyte: 43.6 %
WBC: 5 10*3/uL (ref 3.8–10.8)

## 2021-11-06 LAB — COMPLETE METABOLIC PANEL WITH GFR
AG Ratio: 1.5 (calc) (ref 1.0–2.5)
ALT: 12 U/L (ref 9–46)
AST: 16 U/L (ref 10–35)
Albumin: 4.2 g/dL (ref 3.6–5.1)
Alkaline phosphatase (APISO): 68 U/L (ref 35–144)
BUN/Creatinine Ratio: 8 (calc) (ref 6–22)
BUN: 11 mg/dL (ref 7–25)
CO2: 26 mmol/L (ref 20–32)
Calcium: 9.3 mg/dL (ref 8.6–10.3)
Chloride: 107 mmol/L (ref 98–110)
Creat: 1.35 mg/dL — ABNORMAL HIGH (ref 0.70–1.28)
Globulin: 2.8 g/dL (calc) (ref 1.9–3.7)
Glucose, Bld: 83 mg/dL (ref 65–99)
Potassium: 4.9 mmol/L (ref 3.5–5.3)
Sodium: 142 mmol/L (ref 135–146)
Total Bilirubin: 0.5 mg/dL (ref 0.2–1.2)
Total Protein: 7 g/dL (ref 6.1–8.1)
eGFR: 56 mL/min/{1.73_m2} — ABNORMAL LOW (ref >=60)

## 2021-11-06 LAB — LIPID PANEL
Cholesterol: 153 mg/dL (ref <200)
HDL: 64 mg/dL (ref >=40)
LDL Cholesterol (Calc): 73 mg/dL (calc)
Non-HDL Cholesterol (Calc): 89 mg/dL (calc) (ref <130)
Total CHOL/HDL Ratio: 2.4 (calc) (ref <5.0)
Triglycerides: 78 mg/dL (ref <150)

## 2021-11-06 LAB — HEMOGLOBIN A1C
Hgb A1c MFr Bld: 5.8 % of total Hgb — ABNORMAL HIGH (ref <5.7)
Mean Plasma Glucose: 120 mg/dL
eAG (mmol/L): 6.6 mmol/L

## 2021-11-06 LAB — PSA: PSA: 2.61 ng/mL (ref <=4.00)

## 2021-12-01 ENCOUNTER — Other Ambulatory Visit: Payer: Self-pay | Admitting: Nurse Practitioner

## 2021-12-01 DIAGNOSIS — G5691 Unspecified mononeuropathy of right upper limb: Secondary | ICD-10-CM

## 2021-12-03 ENCOUNTER — Encounter: Payer: Self-pay | Admitting: Nurse Practitioner

## 2021-12-03 DIAGNOSIS — G5691 Unspecified mononeuropathy of right upper limb: Secondary | ICD-10-CM

## 2021-12-03 MED ORDER — GABAPENTIN 300 MG PO CAPS: ORAL_CAPSULE | ORAL | 1 refills | Status: DC

## 2022-03-24 ENCOUNTER — Other Ambulatory Visit: Payer: Self-pay | Admitting: Nurse Practitioner

## 2022-03-24 DIAGNOSIS — I10 Essential (primary) hypertension: Secondary | ICD-10-CM

## 2022-04-08 ENCOUNTER — Ambulatory Visit (INDEPENDENT_AMBULATORY_CARE_PROVIDER_SITE_OTHER): Payer: Medicare PPO | Admitting: Nurse Practitioner

## 2022-04-08 ENCOUNTER — Encounter: Payer: Self-pay | Admitting: Nurse Practitioner

## 2022-04-08 ENCOUNTER — Telehealth: Payer: Self-pay

## 2022-04-08 VITALS — Ht 68.0 in | Wt 162.0 lb

## 2022-04-08 DIAGNOSIS — Z0000 Encounter for general adult medical examination without abnormal findings: Secondary | ICD-10-CM

## 2022-04-08 NOTE — Telephone Encounter (Signed)
Mr. lawrnce, reyez are scheduled for a virtual visit with your provider today.    Just as we do with appointments in the office, we must obtain your consent to participate.  Your consent will be active for this visit and any virtual visit you may have with one of our providers in the next 365 days.    If you have a MyChart account, I can also send a copy of this consent to you electronically.  All virtual visits are billed to your insurance company just like a traditional visit in the office.  As this is a virtual visit, video technology does not allow for your provider to perform a traditional examination.  This may limit your provider's ability to fully assess your condition.  If your provider identifies any concerns that need to be evaluated in person or the need to arrange testing such as labs, EKG, etc, we will make arrangements to do so.    Although advances in technology are sophisticated, we cannot ensure that it will always work on either your end or our end.  If the connection with a video visit is poor, we may have to switch to a telephone visit.  With either a video or telephone visit, we are not always able to ensure that we have a secure connection.   I need to obtain your verbal consent now.   Are you willing to proceed with your visit today?   Tonie Vizcarrondo has provided verbal consent on 04/08/2022 for a virtual visit (video or telephone).   Judd Gaudier, CMA 04/08/2022  11:29 AM

## 2022-04-08 NOTE — Patient Instructions (Signed)
Randall James , Thank you for taking time to come for your Medicare Wellness Visit. I appreciate your ongoing commitment to your health goals. Please review the following plan we discussed and let me know if I can assist you in the future.   Screening recommendations/referrals: Colonoscopy up to date Recommended yearly ophthalmology/optometry visit for glaucoma screening and checkup Recommended yearly dental visit for hygiene and checkup  Vaccinations: Influenza vaccine due annually in September/October Pneumococcal vaccine up to date Tdap vaccine up to date Shingles vaccine up to date    Advanced directives: recommend to complete and place on file.   Conditions/risks identified: hx of stroke, hypertension, high cholesterol, advanced age.  Next appointment: yearly- in person  Preventive Care 62 Years and Older, Male Preventive care refers to lifestyle choices and visits with your health care provider that can promote health and wellness. What does preventive care include? A yearly physical exam. This is also called an annual well check. Dental exams once or twice a year. Routine eye exams. Ask your health care provider how often you should have your eyes checked. Personal lifestyle choices, including: Daily care of your teeth and gums. Regular physical activity. Eating a healthy diet. Avoiding tobacco and drug use. Limiting alcohol use. Practicing safe sex. Taking low doses of aspirin every day. Taking vitamin and mineral supplements as recommended by your health care provider. What happens during an annual well check? The services and screenings done by your health care provider during your annual well check will depend on your age, overall health, lifestyle risk factors, and family history of disease. Counseling  Your health care provider may ask you questions about your: Alcohol use. Tobacco use. Drug use. Emotional well-being. Home and relationship well-being. Sexual  activity. Eating habits. History of falls. Memory and ability to understand (cognition). Work and work Astronomer. Screening  You may have the following tests or measurements: Height, weight, and BMI. Blood pressure. Lipid and cholesterol levels. These may be checked every 5 years, or more frequently if you are over 40 years old. Skin check. Lung cancer screening. You may have this screening every year starting at age 59 if you have a 30-pack-year history of smoking and currently smoke or have quit within the past 15 years. Fecal occult blood test (FOBT) of the stool. You may have this test every year starting at age 76. Flexible sigmoidoscopy or colonoscopy. You may have a sigmoidoscopy every 5 years or a colonoscopy every 10 years starting at age 49. Prostate cancer screening. Recommendations will vary depending on your family history and other risks. Hepatitis C blood test. Hepatitis B blood test. Sexually transmitted disease (STD) testing. Diabetes screening. This is done by checking your blood sugar (glucose) after you have not eaten for a while (fasting). You may have this done every 1-3 years. Abdominal aortic aneurysm (AAA) screening. You may need this if you are a current or former smoker. Osteoporosis. You may be screened starting at age 50 if you are at high risk. Talk with your health care provider about your test results, treatment options, and if necessary, the need for more tests. Vaccines  Your health care provider may recommend certain vaccines, such as: Influenza vaccine. This is recommended every year. Tetanus, diphtheria, and acellular pertussis (Tdap, Td) vaccine. You may need a Td booster every 10 years. Zoster vaccine. You may need this after age 65. Pneumococcal 13-valent conjugate (PCV13) vaccine. One dose is recommended after age 46. Pneumococcal polysaccharide (PPSV23) vaccine. One dose is recommended  after age 33. Talk to your health care provider about which  screenings and vaccines you need and how often you need them. This information is not intended to replace advice given to you by your health care provider. Make sure you discuss any questions you have with your health care provider. Document Released: 06/02/2015 Document Revised: 01/24/2016 Document Reviewed: 03/07/2015 Elsevier Interactive Patient Education  2017 [employer] Prevention in the Home Falls can cause injuries. They can happen to people of all ages. There are many things you can do to make your home safe and to help prevent falls. What can I do on the outside of my home? Regularly fix the edges of walkways and driveways and fix any cracks. Remove anything that might make you trip as you walk through a door, such as a raised step or threshold. Trim any bushes or trees on the path to your home. Use bright outdoor lighting. Clear any walking paths of anything that might make someone trip, such as rocks or tools. Regularly check to see if handrails are loose or broken. Make sure that both sides of any steps have handrails. Any raised decks and porches should have guardrails on the edges. Have any leaves, snow, or ice cleared regularly. Use sand or salt on walking paths during winter. Clean up any spills in your garage right away. This includes oil or grease spills. What can I do in the bathroom? Use night lights. Install grab bars by the toilet and in the tub and shower. Do not use towel bars as grab bars. Use non-skid mats or decals in the tub or shower. If you need to sit down in the shower, use a plastic, non-slip stool. Keep the floor dry. Clean up any water that spills on the floor as soon as it happens. Remove soap buildup in the tub or shower regularly. Attach bath mats securely with double-sided non-slip rug tape. Do not have throw rugs and other things on the floor that can make you trip. What can I do in the bedroom? Use night lights. Make sure that you have a  light by your bed that is easy to reach. Do not use any sheets or blankets that are too big for your bed. They should not hang down onto the floor. Have a firm chair that has side arms. You can use this for support while you get dressed. Do not have throw rugs and other things on the floor that can make you trip. What can I do in the kitchen? Clean up any spills right away. Avoid walking on wet floors. Keep items that you use a lot in easy-to-reach places. If you need to reach something above you, use a strong step stool that has a grab bar. Keep electrical cords out of the way. Do not use floor polish or wax that makes floors slippery. If you must use wax, use non-skid floor wax. Do not have throw rugs and other things on the floor that can make you trip. What can I do with my stairs? Do not leave any items on the stairs. Make sure that there are handrails on both sides of the stairs and use them. Fix handrails that are broken or loose. Make sure that handrails are as long as the stairways. Check any carpeting to make sure that it is firmly attached to the stairs. Fix any carpet that is loose or worn. Avoid having throw rugs at the top or bottom of the stairs. If you  do have throw rugs, attach them to the floor with carpet tape. Make sure that you have a light switch at the top of the stairs and the bottom of the stairs. If you do not have them, ask someone to add them for you. What else can I do to help prevent falls? Wear shoes that: Do not have high heels. Have rubber bottoms. Are comfortable and fit you well. Are closed at the toe. Do not wear sandals. If you use a stepladder: Make sure that it is fully opened. Do not climb a closed stepladder. Make sure that both sides of the stepladder are locked into place. Ask someone to hold it for you, if possible. Clearly mark and make sure that you can see: Any grab bars or handrails. First and last steps. Where the edge of each step  is. Use tools that help you move around (mobility aids) if they are needed. These include: Canes. Walkers. Scooters. Crutches. Turn on the lights when you go into a dark area. Replace any light bulbs as soon as they burn out. Set up your furniture so you have a clear path. Avoid moving your furniture around. If any of your floors are uneven, fix them. If there are any pets around you, be aware of where they are. Review your medicines with your doctor. Some medicines can make you feel dizzy. This can increase your chance of falling. Ask your doctor what other things that you can do to help prevent falls. This information is not intended to replace advice given to you by your health care provider. Make sure you discuss any questions you have with your health care provider. Document Released: 03/02/2009 Document Revised: 10/12/2015 Document Reviewed: 06/10/2014 Elsevier Interactive Patient Education  2017 [employer].

## 2022-04-08 NOTE — Progress Notes (Signed)
Subjective:   Randall James is a 71 y.o. male who presents for Medicare Annual/Subsequent preventive examination.  Review of Systems     Cardiac Risk Factors include: advanced age (>63men, >5 women);family history of premature cardiovascular disease;hypertension;dyslipidemia;male gender     Objective:    Today's Vitals   04/08/22 1348  Weight: 162 lb (73.5 kg)  Height:  (1.727 m)   Body mass index is 24.63 kg/m.     11/05/2021    8:06 AM 04/09/2021    8:50 AM 02/27/2021    1:25 PM 10/20/2020    2:03 PM 10/09/2020    9:43 AM 03/13/2020   10:10 AM 09/13/2019    2:45 PM  Advanced Directives  Does Patient Have a Medical Advance Directive? No No No No No No No  Would patient like information on creating a medical advance directive? No - Patient declined No - Patient declined  No - Patient declined No - Patient declined Yes (MAU/Ambulatory/Procedural Areas - Information given)     Current Medications (verified) Outpatient Encounter Medications as of 04/08/2022  Medication Sig   amLODipine (NORVASC) 10 MG tablet TAKE 1 TABLET(10 MG) BY MOUTH DAILY   aspirin 325 MG EC tablet Take 325 mg by mouth daily.   Cetirizine HCl (ZYRTEC PO) Take by mouth.   Coenzyme Q10 100 MG capsule Take 2 capsules (200 mg total) by mouth daily.   gabapentin (NEURONTIN) 300 MG capsule TAKE 1 CAPSULE BY MOUTH AT BEDTIME   rosuvastatin (CRESTOR) 20 MG tablet TAKE 1 TABLET(20 MG) BY MOUTH DAILY   No facility-administered encounter medications on file as of 04/08/2022.    Allergies (verified) Patient has no known allergies.   History: Past Medical History:  Diagnosis Date   Allergy    Arthritis    hand   Chronic kidney disease    kidney stone "long time ago"   Hyperlipidemia    Hypertension    Stroke [facility]) 2019   Sep 22, 2017   Past Surgical History:  Procedure Laterality Date   sarcidosis surgery- 1984     pt states he isn't sure what was involved with this, possible lymph  nodes removed   Family History  Problem Relation Age of Onset   Pancreatic cancer Mother    Cancer Neg Hx    Hypertension Neg Hx    Heart disease Neg Hx    Colon cancer Neg Hx    Esophageal cancer Neg Hx    Rectal cancer Neg Hx    Stomach cancer Neg Hx    Social History   Socioeconomic History   Marital status: Married    Spouse name: Not on file   Number of children: Not on file   Years of education: Not on file   Highest education level: Not on file  Occupational History   Not on file  Tobacco Use   Smoking status: Former    Years: 5.00    Types: Cigarettes   Smokeless tobacco: Former    Types: Chew    Quit date: 2019   Tobacco comments:    no longer using chew since CVA in 2019  Vaping Use   Vaping Use: Never used  Substance and Sexual Activity   Alcohol use: Not Currently   Drug use: Not Currently    Comment: "in college"   Sexual activity: Not Currently  Other Topics Concern   Not on file  Social History Narrative   Social History      Diet?  none      Do you drink/eat things with caffeine? Coffee- yes      Marital status?              married                      What year were you married? 08/08/1973      Do you live in a house, apartment, assisted living, condo, trailer, etc.? house      Is it one or more stories? 2 stories      How many persons live in your home? 2      Do you have any pets in your home? (please list) none      Highest level of education completed? BS in Education and Social Studies      Current or past profession: school teacher      Do you exercise?         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 no      Do you have a DNR form?                                  If not, do you want to discuss one? no      Do you have signed POA/HPOA for forms? no      Functional Status      Do  you have difficulty bathing or dressing yourself? yes      Do you have difficulty preparing food or eating? yes      Do you have difficulty managing your medications? yes      Do you have difficulty managing your finances? no      Do you have difficulty affording your medications? No    Social Determinants of Health   Financial Resource Strain: Low Risk  (05/11/2018)   Overall Financial Resource Strain (CARDIA)    Difficulty of Paying Living Expenses: Not hard at all  Food Insecurity: No Food Insecurity (05/11/2018)   Hunger Vital Sign    Worried About Running Out of Food in the Last Year: Never true    Ran Out of Food in the Last Year: Never true  Transportation Needs: No Transportation Needs (05/11/2018)   PRAPARE - Administrator, Civil Service (Medical): No    Lack of Transportation (Non-Medical): No  Physical Activity: Sufficiently Active (05/11/2018)   Exercise Vital Sign    Days of Exercise per Week: 2 days    Minutes of Exercise per Session: 120 min  Stress: No Stress Concern Present (05/11/2018)   [employer] of Occupational Health - Occupational Stress Questionnaire    Feeling of Stress : Not at all  Social Connections: Socially Integrated (05/11/2018)   Social Connection and Isolation Panel [NHANES]    Frequency of Communication with Friends and Family: More than three times a week    Frequency of Social Gatherings with Friends and Family: More than three times a week    Attends Religious Services: More than 4 times per year    Active Member of [employer] or Organizations: Yes  Attends Banker Meetings: More than 4 times per year    Marital Status: Married    Tobacco Counseling Counseling given: Not Answered Tobacco comments: no longer using chew since CVA in 2019   Clinical Intake:  Pre-visit preparation completed: Yes  Pain : No/denies pain     BMI - recorded: 24 Nutritional Status: BMI of 19-24  Normal Diabetes: No  How  often do you need to have someone help you when you read instructions, pamphlets, or other written materials from your doctor or pharmacy?: 1 - Never  Diabetic?no         Activities of Daily Living    04/08/2022    2:11 PM  In your present state of health, do you have any difficulty performing the following activities:  Hearing? 0  Vision? 0  Difficulty concentrating or making decisions? 0  Walking or climbing stairs? 0  Dressing or bathing? 0  Doing errands, shopping? 0  Preparing Food and eating ? N  Using the Toilet? N  In the past six months, have you accidently leaked urine? Y  Do you have problems with loss of bowel control? N  Managing your Medications? N  Managing your Finances? N  Housekeeping or managing your Housekeeping? N    Patient Care Team: Sharon Seller, NP as PCP - General (Geriatric Medicine)  Indicate any recent Medical Services you may have received from other than [facility] providers in the past year (date may be approximate).     Assessment:   This is a routine wellness examination for Jadarious.  Hearing/Vision screen Hearing Screening - Comments:: No concerns Vision Screening - Comments:: Wears glasses, last exam in May.  Dr Kern Reap in [place], [place]  Dietary issues and exercise activities discussed: Current Exercise Habits: The patient does not participate in regular exercise at present   Goals Addressed   None    Depression Screen    04/09/2021    9:32 AM 02/27/2021    1:23 PM 03/13/2020   10:09 AM 05/17/2019    4:20 PM 09/30/2018    9:24 AM 05/11/2018    2:35 PM 10/24/2017   11:04 AM  PHQ 2/9 Scores  PHQ - 2 Score 0 0 0 0 0 0 0    Fall Risk    04/08/2022    1:49 PM 11/05/2021    8:06 AM 04/09/2021    9:31 AM 02/27/2021    1:24 PM 10/20/2020    2:03 PM  Fall Risk   Falls in the past year? 0 0 0 0 0  Number falls in past yr:  0 0 0 0  Injury with Fall?  0 0 0 0  Risk for fall due to :  No Fall Risks No Fall Risks No Fall Risks    Follow up  Falls evaluation completed Falls evaluation completed Falls evaluation completed     FALL RISK PREVENTION PERTAINING TO THE HOME:  Any stairs in or around the home? Yes  If so, are there any without handrails? No  Home free of loose throw rugs in walkways, pet beds, electrical cords, etc? Yes  Adequate lighting in your home to reduce risk of falls? Yes   ASSISTIVE DEVICES UTILIZED TO PREVENT FALLS:  Life alert? No  Use of a cane, walker or w/c? No  Grab bars in the bathroom? No  Shower chair or bench in shower? No  Elevated toilet seat or a handicapped toilet? No   TIMED UP AND GO:  Was  the test performed? No .    Cognitive Function:    05/11/2018    2:39 PM  MMSE - Mini Mental State Exam  Orientation to time 5  Orientation to Place 3  Registration 3  Attention/ Calculation 5  Recall 2  Language- name 2 objects 2  Language- repeat 1  Language- follow 3 step command 3  Language- read & follow direction 1  Write a sentence 1  Copy design 0  Total score 26        04/08/2022    1:50 PM 02/27/2021    1:25 PM 05/17/2019    4:22 PM  6CIT Screen  What Year? 0 points 0 points 0 points  What month? 0 points 0 points 0 points  What time? 0 points 0 points 0 points  Count back from 20 0 points 0 points 0 points  Months in reverse 0 points 0 points 0 points  Repeat phrase 0 points 0 points 0 points  Total Score 0 points 0 points 0 points    Immunizations Immunization History  Administered Date(s) Administered   Covid-19, Mrna,Vaccine(Spikevax)59yrs and older 02/19/2022   Fluad Quad(high Dose 65+) 03/13/2020   Influenza, High Dose Seasonal PF 03/07/2021   Influenza,inj,Quad PF,6+ Mos 01/28/2018, 02/25/2019   Influenza-Unspecified 05/20/2017, 02/25/2019, 01/18/2022   Moderna Sars-Covid-2 Vaccination 05/26/2019, 06/24/2019, 04/05/2020, 08/29/2020, 01/26/2021, 09/27/2021   Pneumococcal Conjugate-13 05/11/2018   Pneumococcal Polysaccharide-23 09/13/2019    Tdap 04/11/2019   Zoster Recombinat (Shingrix) 04/11/2019, 11/27/2020    TDAP status: Up to date  Flu Vaccine status: Up to date  Pneumococcal vaccine status: Up to date  Covid-19 vaccine status: Information provided on how to obtain vaccines.   Qualifies for Shingles Vaccine? Yes   Zostavax completed No   Shingrix Completed?: Yes  Screening Tests Health Maintenance  Topic Date Due   COVID-19 Vaccine (7 - Moderna series) 01/28/2022   Fecal DNA (Cologuard)  09/15/2022   Medicare Annual Wellness (AWV)  04/09/2023   Pneumonia Vaccine 67+ Years old  Completed   INFLUENZA VACCINE  Completed   Zoster Vaccines- Shingrix  Completed   HPV VACCINES  Aged Out   Hepatitis C Screening  Discontinued    Health Maintenance  Health Maintenance Due  Topic Date Due   COVID-19 Vaccine (7 - Moderna series) 01/28/2022    Colorectal cancer screening: Type of screening: Cologuard. Completed 2023. Repeat every 3 years  Lung Cancer Screening: (Low Dose CT Chest recommended if Age 68-80 years, 30 pack-year currently smoking OR have quit w/in 15years.) does not qualify.   Lung Cancer Screening Referral: na  Additional Screening:  Hepatitis C Screening: does qualify; Completed 2021   Vision Screening: Recommended annual ophthalmology exams for early detection of glaucoma and other disorders of the eye. Is the patient up to date with their annual eye exam?  Yes  Who is the provider or what is the name of the office in which the patient attends annual eye exams? [facility] If pt is not established with a provider, would they like to be referred to a provider to establish care? No .   Dental Screening: Recommended annual dental exams for proper oral hygiene  Community Resource Referral / Chronic Care Management: CRR required this visit?  No   CCM required this visit?  No      Plan:     I have personally reviewed and noted the following in the patient's chart:   Medical and social  history Use of alcohol, tobacco or illicit  drugs  Current medications and supplements including opioid prescriptions. Patient is not currently taking opioid prescriptions. Functional ability and status Nutritional status Physical activity Advanced directives List of other physicians Hospitalizations, surgeries, and ER visits in previous 12 months Vitals Screenings to include cognitive, depression, and falls Referrals and appointments  In addition, I have reviewed and discussed with patient certain preventive protocols, quality metrics, and best practice recommendations. A written personalized care plan for preventive services as well as general preventive health recommendations were provided to patient.     Sharon SellerJessica K Shivank Pinedo, NP   04/08/2022     Virtual Visit via Telephone Note  I connected with patient 04/08/22 at  2:40 PM EST by telephone and verified that I am speaking with the correct person using two identifiers.  Location: Patient: home Provider: [facility]   I discussed the limitations, risks, security and privacy concerns of performing an evaluation and management service by telephone and the availability of in person appointments. I also discussed with the patient that there may be a patient responsible charge related to this service. The patient expressed understanding and agreed to proceed.   I discussed the assessment and treatment plan with the patient. The patient was provided an opportunity to ask questions and all were answered. The patient agreed with the plan and demonstrated an understanding of the instructions.   The patient was advised to call back or seek an in-person evaluation if the symptoms worsen or if the condition fails to improve as anticipated.  I provided 14 minutes of non-face-to-face time during this encounter.  Janene HarveyJessica K. Biagio BorgEubanks, AGNP Avs printed and mailed

## 2022-04-08 NOTE — Progress Notes (Signed)
   This service is provided via telemedicine  No vital signs collected/recorded due to the encounter was a telemedicine visit.   Location of patient (ex: home, work):  Home  Patient consents to a telephone visit: Yes, see telephone visit dated 04/08/22   Location of the provider (ex: office, home):  [facility], Office   Name of any referring provider:  N/A  Names of all persons participating in the telemedicine service and their role in the encounter:  Carollee Herter Danique Hartsough/CMA, Abbey Chatters, NP, and Patient   Time spent on call:  9 min with medical assistant

## 2022-04-09 ENCOUNTER — Encounter: Payer: Medicare PPO | Admitting: Nurse Practitioner

## 2022-05-05 ENCOUNTER — Other Ambulatory Visit: Payer: Self-pay | Admitting: Nurse Practitioner

## 2022-05-05 DIAGNOSIS — G5691 Unspecified mononeuropathy of right upper limb: Secondary | ICD-10-CM

## 2022-05-06 ENCOUNTER — Encounter: Payer: Self-pay | Admitting: Nurse Practitioner

## 2022-05-06 MED ORDER — GABAPENTIN 300 MG PO CAPS: 300.0000 mg | ORAL_CAPSULE | Freq: Every day | ORAL | 1 refills | Status: DC

## 2022-05-06 NOTE — Addendum Note (Signed)
Addended by: Maurice Small on: 05/06/2022 09:55 AM   Modules accepted: Orders

## 2022-05-06 NOTE — Telephone Encounter (Signed)
Patient is requesting a refill of the following medications: Requested Prescriptions   Pending Prescriptions Disp Refills   gabapentin (NEURONTIN) 300 MG capsule 90 capsule 1    Sig: Take 1 capsule (300 mg total) by mouth at bedtime.   Signed Prescriptions Disp Refills   gabapentin (NEURONTIN) 300 MG capsule 90 capsule 1    Sig: TAKE 1 CAPSULE BY MOUTH AT BEDTIME    Authorizing Provider: Sharon Seller    Ordering User: Edison Simon C    Date of last refill: 12/01/2021   Refill amount: 90  Treatment agreement date: Not on file, notation make on pending appointment for Wednesday 05/08/22

## 2022-05-08 ENCOUNTER — Encounter: Payer: Self-pay | Admitting: Nurse Practitioner

## 2022-05-08 ENCOUNTER — Ambulatory Visit: Payer: Medicare PPO | Admitting: Nurse Practitioner

## 2022-05-08 VITALS — BP 112/70 | HR 58 | Temp 97.3°F | Ht 68.0 in | Wt 161.0 lb

## 2022-05-08 DIAGNOSIS — I10 Essential (primary) hypertension: Secondary | ICD-10-CM

## 2022-05-08 DIAGNOSIS — I69322 Dysarthria following cerebral infarction: Secondary | ICD-10-CM

## 2022-05-08 DIAGNOSIS — R7303 Prediabetes: Secondary | ICD-10-CM | POA: Diagnosis not present

## 2022-05-08 DIAGNOSIS — G5691 Unspecified mononeuropathy of right upper limb: Secondary | ICD-10-CM

## 2022-05-08 DIAGNOSIS — E782 Mixed hyperlipidemia: Secondary | ICD-10-CM

## 2022-05-08 NOTE — Progress Notes (Signed)
Careteam: Patient Care Team: Sharon Seller, NP as PCP - General (Geriatric Medicine)  PLACE OF SERVICE:  [facility]  Advanced Directive information    No Known Allergies  Chief Complaint  Patient presents with   Medical Management of Chronic Issues    6 month follow-up and sign treatment agreement for gabapentin.      HPI: Patient is a 71 y.o. male for routine follow up   Reports he is doing well.   Neuropathy- well controlled on gabapentin.   Hyperlipidemia/hx of CVA- continues on creator  Htn- controlled, no chest pains, swelling, palpitations  Continues to be active and play golf and  softball when the weather allows.    Review of Systems:  Review of Systems  Constitutional:  Negative for chills, fever and weight loss.  HENT:  Negative for tinnitus.   Respiratory:  Negative for cough, sputum production and shortness of breath.   Cardiovascular:  Negative for chest pain, palpitations and leg swelling.  Gastrointestinal:  Negative for abdominal pain, constipation, diarrhea and heartburn.  Genitourinary:  Negative for dysuria, frequency and urgency.  Musculoskeletal:  Negative for back pain, falls, joint pain and myalgias.  Skin: Negative.   Neurological:  Negative for dizziness and headaches.  Psychiatric/Behavioral:  Negative for depression and memory loss. The patient does not have insomnia.     Past Medical History:  Diagnosis Date   Allergy    Arthritis    hand   Chronic kidney disease    kidney stone "long time ago"   Hyperlipidemia    Hypertension    Stroke [facility]) 2019   Sep 22, 2017   Past Surgical History:  Procedure Laterality Date   sarcidosis surgery- 1984     pt states he isn't sure what was involved with this, possible lymph nodes removed   Social History:   reports that he has quit smoking. His smoking use included cigarettes. He quit smokeless tobacco use about 4 years ago.  His smokeless tobacco use included chew. He reports  that he does not currently use alcohol. He reports that he does not currently use drugs.  Family History  Problem Relation Age of Onset   Pancreatic cancer Mother    Cancer Neg Hx    Hypertension Neg Hx    Heart disease Neg Hx    Colon cancer Neg Hx    Esophageal cancer Neg Hx    Rectal cancer Neg Hx    Stomach cancer Neg Hx     Medications: Patient's Medications  New Prescriptions   No medications on file  Previous Medications   AMLODIPINE (NORVASC) 10 MG TABLET    TAKE 1 TABLET(10 MG) BY MOUTH DAILY   ASPIRIN 325 MG EC TABLET    Take 325 mg by mouth daily.   CETIRIZINE HCL (ZYRTEC PO)    Take by mouth.   COENZYME Q10 100 MG CAPSULE    Take 2 capsules (200 mg total) by mouth daily.   GABAPENTIN (NEURONTIN) 300 MG CAPSULE    Take 1 capsule (300 mg total) by mouth at bedtime.   ROSUVASTATIN (CRESTOR) 20 MG TABLET    TAKE 1 TABLET(20 MG) BY MOUTH DAILY  Modified Medications   No medications on file  Discontinued Medications   No medications on file    Physical Exam:  Vitals:   05/08/22 0922  BP: 112/70  Pulse: (!) 58  Temp: (!) 97.3 F (36.3 C)  TempSrc: Temporal  SpO2: 99%  Weight: 161 lb (  73 kg)  Height: 5\' 8"  (1.727 m)   Body mass index is 24.48 kg/m. Wt Readings from Last 3 Encounters:  05/08/22 161 lb (73 kg)  04/08/22 162 lb (73.5 kg)  11/05/21 160 lb 12.8 oz (72.9 kg)    Physical Exam Constitutional:      General: He is not in acute distress.    Appearance: He is well-developed. He is not diaphoretic.  HENT:     Head: Normocephalic and atraumatic.     Right Ear: External ear normal.     Left Ear: External ear normal.     Mouth/Throat:     Pharynx: No oropharyngeal exudate.  Eyes:     Conjunctiva/sclera: Conjunctivae normal.     Pupils: Pupils are equal, round, and reactive to light.  Cardiovascular:     Rate and Rhythm: Normal rate and regular rhythm.     Heart sounds: Normal heart sounds.  Pulmonary:     Effort: Pulmonary effort is normal.      Breath sounds: Normal breath sounds.  Abdominal:     General: Bowel sounds are normal.     Palpations: Abdomen is soft.  Musculoskeletal:        General: No tenderness.     Cervical back: Normal range of motion and neck supple.     Right lower leg: No edema.     Left lower leg: No edema.  Skin:    General: Skin is warm and dry.  Neurological:     Mental Status: He is alert and oriented to person, place, and time.     Labs reviewed: Basic Metabolic Panel: Recent Labs    11/05/21 0830  NA 142  K 4.9  CL 107  CO2 26  GLUCOSE 83  BUN 11  CREATININE 1.35*  CALCIUM 9.3   Liver Function Tests: Recent Labs    11/05/21 0830  AST 16  ALT 12  BILITOT 0.5  PROT 7.0   No results for input(s): "LIPASE", "AMYLASE" in the last 8760 hours. No results for input(s): "AMMONIA" in the last 8760 hours. CBC: Recent Labs    11/05/21 0830  WBC 5.0  NEUTROABS 2,015  HGB 13.1*  HCT 41.6  MCV 83.9  PLT 196   Lipid Panel: Recent Labs    11/05/21 0830  CHOL 153  HDL 64  LDLCALC 73  TRIG 78  CHOLHDL 2.4   TSH: No results for input(s): "TSH" in the last 8760 hours. A1C: Lab Results  Component Value Date   HGBA1C 5.8 (H) 11/05/2021     Assessment/Plan 1. Mixed hyperlipidemia -continues on crestor and dietary modifications - Lipid panel  2. Neuropathy, upper extremity, right Continues on gabapentin qhs  3. Prediabetes -dietary modifications encouraged - Hemoglobin A1c  4. Essential hypertension -Blood pressure well controlled, goal bp <140/90 Continue current medications and dietary modifications follow metabolic panel - COMPLETE METABOLIC PANEL WITH GFR - CBC with Differential/PlateletR - CBC with Differential/Platelet  5. 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 11/09/2022. Janene Harvey  [facility] [phone]

## 2022-05-09 LAB — CBC WITH DIFFERENTIAL/PLATELET
Absolute Monocytes: 468 cells/uL (ref 200–950)
Basophils Absolute: 32 cells/uL (ref 0–200)
Basophils Relative: 0.7 %
Eosinophils Absolute: 189 cells/uL (ref 15–500)
Eosinophils Relative: 4.2 %
HCT: 40.2 % (ref 38.5–50.0)
Hemoglobin: 13 g/dL — ABNORMAL LOW (ref 13.2–17.1)
Lymphs Abs: 1818 cells/uL (ref 850–3900)
MCH: 26.1 pg — ABNORMAL LOW (ref 27.0–33.0)
MCHC: 32.3 g/dL (ref 32.0–36.0)
MCV: 80.7 fL (ref 80.0–100.0)
MPV: 11.7 fL (ref 7.5–12.5)
Monocytes Relative: 10.4 %
Neutro Abs: 1994 cells/uL (ref 1500–7800)
Neutrophils Relative %: 44.3 %
Platelets: 223 10*3/uL (ref 140–400)
RBC: 4.98 10*6/uL (ref 4.20–5.80)
RDW: 13.5 % (ref 11.0–15.0)
Total Lymphocyte: 40.4 %
WBC: 4.5 10*3/uL (ref 3.8–10.8)

## 2022-05-09 LAB — COMPLETE METABOLIC PANEL WITH GFR
AG Ratio: 1.6 (calc) (ref 1.0–2.5)
ALT: 13 U/L (ref 9–46)
AST: 18 U/L (ref 10–35)
Albumin: 4.4 g/dL (ref 3.6–5.1)
Alkaline phosphatase (APISO): 70 U/L (ref 35–144)
BUN: 10 mg/dL (ref 7–25)
CO2: 28 mmol/L (ref 20–32)
Calcium: 9.4 mg/dL (ref 8.6–10.3)
Chloride: 107 mmol/L (ref 98–110)
Creat: 1.26 mg/dL (ref 0.70–1.28)
Globulin: 2.7 g/dL (calc) (ref 1.9–3.7)
Glucose, Bld: 91 mg/dL (ref 65–99)
Potassium: 4.6 mmol/L (ref 3.5–5.3)
Sodium: 142 mmol/L (ref 135–146)
Total Bilirubin: 0.3 mg/dL (ref 0.2–1.2)
Total Protein: 7.1 g/dL (ref 6.1–8.1)
eGFR: 60 mL/min/{1.73_m2} (ref >=60)

## 2022-05-09 LAB — LIPID PANEL
Cholesterol: 154 mg/dL (ref <200)
HDL: 72 mg/dL (ref >=40)
LDL Cholesterol (Calc): 64 mg/dL (calc)
Non-HDL Cholesterol (Calc): 82 mg/dL (calc) (ref <130)
Total CHOL/HDL Ratio: 2.1 (calc) (ref <5.0)
Triglycerides: 94 mg/dL (ref <150)

## 2022-05-09 LAB — HEMOGLOBIN A1C
Hgb A1c MFr Bld: 6 % of total Hgb — ABNORMAL HIGH (ref <5.7)
Mean Plasma Glucose: 126 mg/dL
eAG (mmol/L): 7 mmol/L

## 2022-05-10 ENCOUNTER — Ambulatory Visit: Payer: Medicare PPO | Admitting: Nurse Practitioner

## 2022-08-23 ENCOUNTER — Other Ambulatory Visit: Payer: Self-pay | Admitting: Nurse Practitioner

## 2022-08-23 DIAGNOSIS — E782 Mixed hyperlipidemia: Secondary | ICD-10-CM

## 2022-11-08 ENCOUNTER — Encounter: Payer: Self-pay | Admitting: Nurse Practitioner

## 2022-11-08 ENCOUNTER — Ambulatory Visit: Payer: Medicare PPO | Admitting: Nurse Practitioner

## 2022-11-08 VITALS — BP 132/78 | HR 54 | Temp 97.3°F | Ht 68.0 in | Wt 164.0 lb

## 2022-11-08 DIAGNOSIS — N401 Enlarged prostate with lower urinary tract symptoms: Secondary | ICD-10-CM

## 2022-11-08 DIAGNOSIS — E782 Mixed hyperlipidemia: Secondary | ICD-10-CM | POA: Diagnosis not present

## 2022-11-08 DIAGNOSIS — G5691 Unspecified mononeuropathy of right upper limb: Secondary | ICD-10-CM | POA: Diagnosis not present

## 2022-11-08 DIAGNOSIS — Z1212 Encounter for screening for malignant neoplasm of rectum: Secondary | ICD-10-CM

## 2022-11-08 DIAGNOSIS — R7303 Prediabetes: Secondary | ICD-10-CM | POA: Diagnosis not present

## 2022-11-08 DIAGNOSIS — R350 Frequency of micturition: Secondary | ICD-10-CM

## 2022-11-08 DIAGNOSIS — Z1211 Encounter for screening for malignant neoplasm of colon: Secondary | ICD-10-CM

## 2022-11-08 DIAGNOSIS — I10 Essential (primary) hypertension: Secondary | ICD-10-CM

## 2022-11-08 DIAGNOSIS — I69322 Dysarthria following cerebral infarction: Secondary | ICD-10-CM

## 2022-11-08 NOTE — Progress Notes (Signed)
Careteam: Patient Care Team: Sharon Seller, NP as PCP - General (Geriatric Medicine)  PLACE OF SERVICE:  [facility]  Advanced Directive information Does Patient Have a Medical Advance Directive?: No, Would patient like information on creating a medical advance directive?: No - Patient declined  No Known Allergies  Chief Complaint  Patient presents with   Medical Management of Chronic Issues    6 month follow-up.      HPI: Patient is a 72 y.o. male for routine follow up.   Continues to play golf routinely   Reports he is doing well.   No worsening of neuropathy, continues on gabapentin at night time.   Continues on crestor 20 mg daily, no myalagias  Htn- blood pressure at goal on norvasc    Review of Systems:  Review of Systems  Constitutional:  Negative for chills, fever and weight loss.  HENT:  Negative for tinnitus.   Respiratory:  Negative for cough, sputum production and shortness of breath.   Cardiovascular:  Negative for chest pain, palpitations and leg swelling.  Gastrointestinal:  Negative for abdominal pain, constipation, diarrhea and heartburn.  Genitourinary:  Negative for dysuria, frequency and urgency.  Musculoskeletal:  Negative for back pain, falls, joint pain and myalgias.  Skin: Negative.   Neurological:  Negative for dizziness and headaches.  Psychiatric/Behavioral:  Negative for depression and memory loss. The patient does not have insomnia.     Past Medical History:  Diagnosis Date   Allergy    Arthritis    hand   Chronic kidney disease    kidney stone "long time ago"   Hyperlipidemia    Hypertension    Stroke [facility]) 2019   Sep 22, 2017   Past Surgical History:  Procedure Laterality Date   sarcidosis surgery- 1984     pt states he isn't sure what was involved with this, possible lymph nodes removed   Social History:   reports that he has quit smoking. His smoking use included cigarettes. He quit smokeless tobacco use about 5  years ago.  His smokeless tobacco use included chew. He reports that he does not currently use alcohol. He reports that he does not currently use drugs.  Family History  Problem Relation Age of Onset   Pancreatic cancer Mother    Cancer Neg Hx    Hypertension Neg Hx    Heart disease Neg Hx    Colon cancer Neg Hx    Esophageal cancer Neg Hx    Rectal cancer Neg Hx    Stomach cancer Neg Hx     Medications: Patient's Medications  New Prescriptions   No medications on file  Previous Medications   AMLODIPINE (NORVASC) 10 MG TABLET    TAKE 1 TABLET(10 MG) BY MOUTH DAILY   ASPIRIN 325 MG EC TABLET    Take 325 mg by mouth daily.   CETIRIZINE HCL (ZYRTEC PO)    Take by mouth.   COENZYME Q10 100 MG CAPSULE    Take 2 capsules (200 mg total) by mouth daily.   GABAPENTIN (NEURONTIN) 300 MG CAPSULE    Take 1 capsule (300 mg total) by mouth at bedtime.   ROSUVASTATIN (CRESTOR) 20 MG TABLET    TAKE 1 TABLET(20 MG) BY MOUTH DAILY  Modified Medications   No medications on file  Discontinued Medications   No medications on file    Physical Exam:  Vitals:   11/08/22 1506  BP: 132/78  Pulse: (!) 54  Temp: (!) 97.3  F (36.3 C)  TempSrc: Temporal  SpO2: 98%  Weight: 164 lb (74.4 kg)  Height: 5\' 8"  (1.727 m)   Body mass index is 24.94 kg/m. Wt Readings from Last 3 Encounters:  11/08/22 164 lb (74.4 kg)  05/08/22 161 lb (73 kg)  04/08/22 162 lb (73.5 kg)    Physical Exam Constitutional:      General: He is not in acute distress.    Appearance: He is well-developed. He is not diaphoretic.  HENT:     Head: Normocephalic and atraumatic.     Right Ear: External ear normal.     Left Ear: External ear normal.     Mouth/Throat:     Pharynx: No oropharyngeal exudate.  Eyes:     Conjunctiva/sclera: Conjunctivae normal.     Pupils: Pupils are equal, round, and reactive to light.  Cardiovascular:     Rate and Rhythm: Normal rate and regular rhythm.     Heart sounds: Normal heart  sounds.  Pulmonary:     Effort: Pulmonary effort is normal.     Breath sounds: Normal breath sounds.  Abdominal:     General: Bowel sounds are normal.     Palpations: Abdomen is soft.  Musculoskeletal:        General: No tenderness.     Cervical back: Normal range of motion and neck supple.     Right lower leg: No edema.     Left lower leg: No edema.  Skin:    General: Skin is warm and dry.  Neurological:     Mental Status: He is alert and oriented to person, place, and time.     Labs reviewed: Basic Metabolic Panel: Recent Labs    05/08/22 0000  NA 142  K 4.6  CL 107  CO2 28  GLUCOSE 91  BUN 10  CREATININE 1.26  CALCIUM 9.4   Liver Function Tests: Recent Labs    05/08/22 0000  AST 18  ALT 13  BILITOT 0.3  PROT 7.1   No results for input(s): "LIPASE", "AMYLASE" in the last 8760 hours. No results for input(s): "AMMONIA" in the last 8760 hours. CBC: Recent Labs    05/08/22 0000  WBC 4.5  NEUTROABS 1,994  HGB 13.0*  HCT 40.2  MCV 80.7  PLT 223   Lipid Panel: Recent Labs    05/08/22 0000  CHOL 154  HDL 72  LDLCALC 64  TRIG 94  CHOLHDL 2.1   TSH: No results for input(s): "TSH" in the last 8760 hours. A1C: Lab Results  Component Value Date   HGBA1C 6.0 (H) 05/08/2022     Assessment/Plan 1. Mixed hyperlipidemia Continues on crestor 20 mg daily with diet - COMPLETE METABOLIC PANEL WITH GFR  2. Neuropathy, upper extremity, right Stable, continues on gabapenetin  3. Prediabetes -continue dietary modifications  - Hemoglobin A1c  4. Essential hypertension -Blood pressure well controlled, goal bp <140/90 Continue current medications and dietary modifications follow metabolic panel - COMPLETE METABOLIC PANEL WITH GFR - CBC with Differential/Platelet  5. 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 Benign prostatic hyperplasia with urinary frequency -stable no worsening of symptoms    7. Encounter for colorectal cancer screening - Cologuard   Return in about 6 months (around 05/10/2023) for routine follow up.  Janene Harvey. Biagio Borg [facility] [phone]

## 2022-11-09 LAB — CBC WITH DIFFERENTIAL/PLATELET
Absolute Monocytes: 616 cells/uL (ref 200–950)
Basophils Absolute: 22 cells/uL (ref 0–200)
Basophils Relative: 0.4 %
Eosinophils Absolute: 132 cells/uL (ref 15–500)
Eosinophils Relative: 2.4 %
HCT: 43.2 % (ref 38.5–50.0)
Hemoglobin: 13.7 g/dL (ref 13.2–17.1)
Lymphs Abs: 2684 cells/uL (ref 850–3900)
MCH: 25.7 pg — ABNORMAL LOW (ref 27.0–33.0)
MCHC: 31.7 g/dL — ABNORMAL LOW (ref 32.0–36.0)
MCV: 80.9 fL (ref 80.0–100.0)
MPV: 11.8 fL (ref 7.5–12.5)
Monocytes Relative: 11.2 %
Neutro Abs: 2046 cells/uL (ref 1500–7800)
Neutrophils Relative %: 37.2 %
Platelets: 171 10*3/uL (ref 140–400)
RBC: 5.34 10*6/uL (ref 4.20–5.80)
RDW: 14.1 % (ref 11.0–15.0)
Total Lymphocyte: 48.8 %
WBC: 5.5 10*3/uL (ref 3.8–10.8)

## 2022-11-09 LAB — COMPLETE METABOLIC PANEL WITH GFR
AG Ratio: 1.4 (calc) (ref 1.0–2.5)
ALT: 14 U/L (ref 9–46)
AST: 16 U/L (ref 10–35)
Albumin: 4.1 g/dL (ref 3.6–5.1)
Alkaline phosphatase (APISO): 70 U/L (ref 35–144)
BUN: 9 mg/dL (ref 7–25)
CO2: 24 mmol/L (ref 20–32)
Calcium: 9.3 mg/dL (ref 8.6–10.3)
Chloride: 105 mmol/L (ref 98–110)
Creat: 1.28 mg/dL (ref 0.70–1.28)
Globulin: 3 g/dL (calc) (ref 1.9–3.7)
Glucose, Bld: 78 mg/dL (ref 65–99)
Potassium: 4.2 mmol/L (ref 3.5–5.3)
Sodium: 140 mmol/L (ref 135–146)
Total Bilirubin: 0.5 mg/dL (ref 0.2–1.2)
Total Protein: 7.1 g/dL (ref 6.1–8.1)
eGFR: 59 mL/min/{1.73_m2} — ABNORMAL LOW (ref >=60)

## 2022-11-09 LAB — HEMOGLOBIN A1C
Hgb A1c MFr Bld: 5.9 % of total Hgb — ABNORMAL HIGH (ref <5.7)
Mean Plasma Glucose: 123 mg/dL
eAG (mmol/L): 6.8 mmol/L

## 2022-11-12 ENCOUNTER — Other Ambulatory Visit: Payer: Self-pay | Admitting: Nurse Practitioner

## 2022-11-12 DIAGNOSIS — G5691 Unspecified mononeuropathy of right upper limb: Secondary | ICD-10-CM

## 2022-11-12 NOTE — Telephone Encounter (Signed)
Patient is requesting a refill of the following medications: Requested Prescriptions   Pending Prescriptions Disp Refills   gabapentin (NEURONTIN) 300 MG capsule [Pharmacy Med Name: GABAPENTIN 300MG  CAPSULES] 90 capsule     Sig: TAKE 1 CAPSULE(300 MG) BY MOUTH AT BEDTIME    Date of last refill: 05/06/2022  Refill amount: 1  Treatment agreement date: 05/07/2022

## 2022-12-04 LAB — COLOGUARD: COLOGUARD: NEGATIVE

## 2023-04-21 ENCOUNTER — Encounter: Payer: Medicare PPO | Admitting: Nurse Practitioner

## 2023-04-21 ENCOUNTER — Encounter: Payer: Self-pay | Admitting: Nurse Practitioner

## 2023-04-21 ENCOUNTER — Ambulatory Visit (INDEPENDENT_AMBULATORY_CARE_PROVIDER_SITE_OTHER): Payer: Medicare PPO | Admitting: Nurse Practitioner

## 2023-04-21 VITALS — BP 114/70 | HR 61 | Temp 97.5°F | Ht 68.0 in | Wt 168.0 lb

## 2023-04-21 DIAGNOSIS — Z0000 Encounter for general adult medical examination without abnormal findings: Secondary | ICD-10-CM | POA: Diagnosis not present

## 2023-04-21 DIAGNOSIS — Z23 Encounter for immunization: Secondary | ICD-10-CM

## 2023-04-21 NOTE — Progress Notes (Signed)
Subjective:   Randall James is a 72 y.o. male who presents for Medicare Annual/Subsequent preventive examination.  Visit Complete: In person at [facility]  Cardiac Risk Factors include: advanced age (>32men, >21 women);hypertension;family history of premature cardiovascular disease;dyslipidemia;male gender     Objective:    Today's Vitals   04/21/23 1042  BP: 114/70  Pulse: 61  Temp: (!) 97.5 F (36.4 C)  TempSrc: Temporal  SpO2: 98%  Weight: 168 lb (76.2 kg)  Height: 5\' 8"  (1.727 m)   Body mass index is 25.54 kg/m.     04/21/2023   10:42 AM 11/08/2022    3:06 PM 11/05/2021    8:06 AM 04/09/2021    8:50 AM 02/27/2021    1:25 PM 10/20/2020    2:03 PM 10/09/2020    9:43 AM  Advanced Directives  Does Patient Have a Medical Advance Directive? No No No No No No No  Would patient like information on creating a medical advance directive? Yes (MAU/Ambulatory/Procedural Areas - Information given) No - Patient declined No - Patient declined No - Patient declined  No - Patient declined No - Patient declined    Current Medications (verified) Outpatient Encounter Medications as of 04/21/2023  Medication Sig   amLODipine (NORVASC) 10 MG tablet TAKE 1 TABLET(10 MG) BY MOUTH DAILY   aspirin 325 MG EC tablet Take 325 mg by mouth daily.   Cetirizine HCl (ZYRTEC PO) Take by mouth.   Coenzyme Q10 100 MG capsule Take 2 capsules (200 mg total) by mouth daily.   gabapentin (NEURONTIN) 300 MG capsule TAKE 1 CAPSULE(300 MG) BY MOUTH AT BEDTIME   rosuvastatin (CRESTOR) 20 MG tablet TAKE 1 TABLET(20 MG) BY MOUTH DAILY   No facility-administered encounter medications on file as of 04/21/2023.    Allergies (verified) Patient has no known allergies.   History: Past Medical History:  Diagnosis Date   Allergy    Arthritis    hand   Chronic kidney disease    kidney stone "long time ago"   Hyperlipidemia    Hypertension    Stroke [facility]) 2019   Sep 22, 2017   Past Surgical History:   Procedure Laterality Date   sarcidosis surgery- 1984     pt states he isn't sure what was involved with this, possible lymph nodes removed   Family History  Problem Relation Age of Onset   Pancreatic cancer Mother    Cancer Neg Hx    Hypertension Neg Hx    Heart disease Neg Hx    Colon cancer Neg Hx    Esophageal cancer Neg Hx    Rectal cancer Neg Hx    Stomach cancer Neg Hx    Social History   Socioeconomic History   Marital status: Married    Spouse name: Not on file   Number of children: Not on file   Years of education: Not on file   Highest education level: Not on file  Occupational History   Not on file  Tobacco Use   Smoking status: Former    Types: Cigarettes   Smokeless tobacco: Former    Types: Chew    Quit date: 2019   Tobacco comments:    Quit cigarettes around age 14, no longer using chew since CVA in 2019  Vaping Use   Vaping status: Never Used  Substance and Sexual Activity   Alcohol use: Not Currently   Drug use: Not Currently    Comment: "in college"   Sexual activity: Not Currently  Other Topics Concern   Not on file  Social History Narrative   Social History      Diet? none      Do you drink/eat things with caffeine? Coffee- yes      Marital status?              married                      What year were you married? 08/08/1973      Do you live in a house, apartment, assisted living, condo, trailer, etc.? house      Is it one or more stories? 2 stories      How many persons live in your home? 2      Do you have any pets in your home? (please list) none      Highest level of education completed? BS in Education and Social Studies      Current or past profession: school teacher      Do you exercise?         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 no      Do you have a DNR form?                                   If not, do you want to discuss one? no      Do you have signed POA/HPOA for forms? no      Functional Status      Do you have difficulty bathing or dressing yourself? yes      Do you have difficulty preparing food or eating? yes      Do you have difficulty managing your medications? yes      Do you have difficulty managing your finances? no      Do you have difficulty affording your medications? No    Social Determinants of Health   Financial Resource Strain: Low Risk  (05/11/2018)   Overall Financial Resource Strain (CARDIA)    Difficulty of Paying Living Expenses: Not hard at all  Food Insecurity: No Food Insecurity (05/11/2018)   Hunger Vital Sign    Worried About Running Out of Food in the Last Year: Never true    Ran Out of Food in the Last Year: Never true  Transportation Needs: No Transportation Needs (05/11/2018)   PRAPARE - Administrator, Civil Service (Medical): No    Lack of Transportation (Non-Medical): No  Physical Activity: Sufficiently Active (05/11/2018)   Exercise Vital Sign    Days of Exercise per Week: 2 days    Minutes of Exercise per Session: 120 min  Stress: No Stress Concern Present (05/11/2018)   [employer] of Occupational Health - Occupational Stress Questionnaire    Feeling of Stress : Not at all  Social Connections: Socially Integrated (05/11/2018)   Social Connection and Isolation Panel [NHANES]    Frequency of Communication with Friends and Family: More than three times a week    Frequency of Social Gatherings with Friends and Family: More than three times a week  Attends Religious Services: More than 4 times per year    Active Member of Clubs or Organizations: Yes    Attends Banker Meetings: More than 4 times per year    Marital Status: Married    Tobacco Counseling Counseling given: Not Answered Tobacco comments: Quit cigarettes around age 6, no longer using chew since CVA in  2019   Clinical Intake:                        Activities of Daily Living    04/21/2023   10:56 AM  In your present state of health, do you have any difficulty performing the following activities:  Hearing? 0  Vision? 0  Difficulty concentrating or making decisions? 0  Walking or climbing stairs? 0  Dressing or bathing? 0  Doing errands, shopping? 0  Preparing Food and eating ? N  Using the Toilet? N  In the past six months, have you accidently leaked urine? Y  Do you have problems with loss of bowel control? N  Managing your Medications? N  Managing your Finances? N  Housekeeping or managing your Housekeeping? N    Patient Care Team: Sharon Seller, NP as PCP - General (Geriatric Medicine)  Indicate any recent Medical Services you may have received from other than [facility] providers in the past year (date may be approximate).     Assessment:   This is a routine wellness examination for Urbain.  Hearing/Vision screen Hearing Screening - Comments:: No hearing issues  Vision Screening - Comments:: Patient has upcoming appointment with Dr.Kissell in [place]    Goals Addressed   None   Depression Screen    04/21/2023   10:42 AM 11/08/2022    3:05 PM 05/08/2022    9:21 AM 04/09/2021    9:32 AM 02/27/2021    1:23 PM 03/13/2020   10:09 AM 05/17/2019    4:20 PM  PHQ 2/9 Scores  PHQ - 2 Score 0 0 0 0 0 0 0    Fall Risk    04/21/2023   10:41 AM 11/08/2022    3:05 PM 05/08/2022    9:21 AM 04/08/2022    1:49 PM 11/05/2021    8:06 AM  Fall Risk   Falls in the past year? 0 0 0 0 0  Number falls in past yr: 0 0 0  0  Injury with Fall? 0 0 0  0  Risk for fall due to : No Fall Risks No Fall Risks No Fall Risks  No Fall Risks  Follow up Falls evaluation completed Falls evaluation completed Falls evaluation completed  Falls evaluation completed    MEDICARE RISK AT HOME: Medicare Risk at Home Any stairs in or around the home?: Yes If so, are there  any without handrails?: No Home free of loose throw rugs in walkways, pet beds, electrical cords, etc?: Yes Adequate lighting in your home to reduce risk of falls?: Yes Life alert?: No Use of a cane, walker or w/c?: No Grab bars in the bathroom?: No Shower chair or bench in shower?: No Elevated toilet seat or a handicapped toilet?: Yes  TIMED UP AND GO:  Was the test performed?  No    Cognitive Function:    04/21/2023   10:47 AM 05/11/2018    2:39 PM  MMSE - Mini Mental State Exam  Orientation to time 5 5  Orientation to Place 3 3  Orientation to Place-comments Missed county and name  of office   Registration 3 3  Attention/ Calculation 5 5  Recall 1 2  Recall-comments Missed Table and Penny   Language- name 2 objects 2 2  Language- repeat 1 1  Language- follow 3 step command 3 3  Language- read & follow direction 1 1  Write a sentence 1 1  Copy design 1 0  Total score 26 26        04/08/2022    1:50 PM 02/27/2021    1:25 PM 05/17/2019    4:22 PM  6CIT Screen  What Year? 0 points 0 points 0 points  What month? 0 points 0 points 0 points  What time? 0 points 0 points 0 points  Count back from 20 0 points 0 points 0 points  Months in reverse 0 points 0 points 0 points  Repeat phrase 0 points 0 points 0 points  Total Score 0 points 0 points 0 points    Immunizations Immunization History  Administered Date(s) Administered   Fluad Quad(high Dose 65+) 03/13/2020   Influenza, High Dose Seasonal PF 03/07/2021, 01/19/2023   Influenza,inj,Quad PF,6+ Mos 01/28/2018, 02/25/2019   Influenza-Unspecified 05/20/2017, 02/25/2019, 01/18/2022   Moderna Covid-19 Fall Seasonal Vaccine 37yrs & older 02/19/2022, 01/19/2023   Moderna Sars-Covid-2 Vaccination 05/26/2019, 06/24/2019, 04/05/2020, 08/29/2020, 01/26/2021, 09/27/2021   Pneumococcal Conjugate-13 05/11/2018   Pneumococcal Polysaccharide-23 09/13/2019   Tdap 04/11/2019   Zoster Recombinant(Shingrix) 04/11/2019, 11/27/2020     TDAP status: Up to date  Flu Vaccine status: Up to date  Pneumococcal vaccine status: Up to date  Covid-19 vaccine status: Information provided on how to obtain vaccines.   Qualifies for Shingles Vaccine? Yes   Zostavax completed Yes   Shingrix Completed?: Yes  Screening Tests Health Maintenance  Topic Date Due   Medicare Annual Wellness (AWV)  04/20/2024   Fecal DNA (Cologuard)  11/26/2025   DTaP/Tdap/Td (2 - Td or Tdap) 04/10/2029   Pneumonia Vaccine 46+ Years old  Completed   INFLUENZA VACCINE  Completed   COVID-19 Vaccine  Completed   Hepatitis C Screening  Completed   Zoster Vaccines- Shingrix  Completed   HPV VACCINES  Aged Out    Health Maintenance  There are no preventive care reminders to display for this patient.   Colorectal cancer screening: Type of screening: Cologuard. Completed 11/27/2022. Repeat every 3 years  Lung Cancer Screening: (Low Dose CT Chest recommended if Age 42-80 years, 20 pack-year currently smoking OR have quit w/in 15years.) does not qualify.   Lung Cancer Screening Referral: na  Additional Screening:  Hepatitis C Screening: does qualify; Completed   Vision Screening: Recommended annual ophthalmology exams for early detection of glaucoma and other disorders of the eye. Is the patient up to date with their annual eye exam?  Yes  Who is the provider or what is the name of the office in which the patient attends annual eye exams?  Dr. Valorie Roosevelt  If pt is not established with a provider, would they like to be referred to a provider to establish care? No .   Dental Screening: Recommended annual dental exams for proper oral hygiene    Community Resource Referral / Chronic Care Management: CRR required this visit?  No   CCM required this visit?  No     Plan:     I have personally reviewed and noted the following in the patient's chart:   Medical and social history Use of alcohol, tobacco or illicit drugs  Current medications  and supplements including opioid  prescriptions. Patient is not currently taking opioid prescriptions. Functional ability and status Nutritional status Physical activity Advanced directives List of other physicians Hospitalizations, surgeries, and ER visits in previous 12 months Vitals Screenings to include cognitive, depression, and falls Referrals and appointments  In addition, I have reviewed and discussed with patient certain preventive protocols, quality metrics, and best practice recommendations. A written personalized care plan for preventive services as well as general preventive health recommendations were provided to patient.     Sharon Seller, NP   04/21/2023   After Visit Summary: (In Person-Printed) AVS printed and given to the patient

## 2023-04-21 NOTE — Patient Instructions (Signed)
  Randall James , Thank you for taking time to come for your Medicare Wellness Visit. I appreciate your ongoing commitment to your health goals. Please review the following plan we discussed and let me know if I can assist you in the future.  This is a list of the screening recommended for you and due dates:  Health Maintenance  Topic Date Due   Medicare Annual Wellness Visit  04/20/2024   Cologuard (Stool DNA test)  11/26/2025   DTaP/Tdap/Td vaccine (2 - Td or Tdap) 04/10/2029   Pneumonia Vaccine  Completed   Flu Shot  Completed   COVID-19 Vaccine  Completed   Hepatitis C Screening  Completed   Zoster (Shingles) Vaccine  Completed   HPV Vaccine  Aged Out

## 2023-04-22 ENCOUNTER — Encounter: Payer: Medicare PPO | Admitting: Nurse Practitioner

## 2023-05-06 ENCOUNTER — Other Ambulatory Visit: Payer: Self-pay | Admitting: Nurse Practitioner

## 2023-05-06 DIAGNOSIS — I10 Essential (primary) hypertension: Secondary | ICD-10-CM

## 2023-05-09 ENCOUNTER — Encounter: Payer: Self-pay | Admitting: Nurse Practitioner

## 2023-05-09 ENCOUNTER — Ambulatory Visit: Payer: Medicare PPO | Admitting: Nurse Practitioner

## 2023-05-09 VITALS — BP 120/78 | HR 63 | Temp 97.7°F | Resp 17 | Ht 68.0 in | Wt 165.2 lb

## 2023-05-09 DIAGNOSIS — G5691 Unspecified mononeuropathy of right upper limb: Secondary | ICD-10-CM | POA: Diagnosis not present

## 2023-05-09 DIAGNOSIS — I69322 Dysarthria following cerebral infarction: Secondary | ICD-10-CM

## 2023-05-09 DIAGNOSIS — E782 Mixed hyperlipidemia: Secondary | ICD-10-CM | POA: Diagnosis not present

## 2023-05-09 DIAGNOSIS — I10 Essential (primary) hypertension: Secondary | ICD-10-CM

## 2023-05-09 DIAGNOSIS — N401 Enlarged prostate with lower urinary tract symptoms: Secondary | ICD-10-CM

## 2023-05-09 DIAGNOSIS — R7303 Prediabetes: Secondary | ICD-10-CM

## 2023-05-09 DIAGNOSIS — R350 Frequency of micturition: Secondary | ICD-10-CM

## 2023-05-09 NOTE — Progress Notes (Signed)
Careteam: Patient Care Team: Sharon Seller, NP as PCP - General (Geriatric Medicine)  PLACE OF SERVICE:  [facility]  Advanced Directive information Does Patient Have a Medical Advance Directive?: No, Would patient like information on creating a medical advance directive?: Yes (MAU/Ambulatory/Procedural Areas - Information given) (Paperwork given at previous visit)  No Known Allergies  Chief Complaint  Patient presents with   Medical Management of Chronic Issues    6 month follow-up.     HPI: Patient is a 72 y.o. male for routine follow up.   Reports he has been doing well.  Continues to play golf and stay active.  Weight has been stable.   No issues with sleep or mood.   Htn- well controlled.   Hx of cva- stable, ongoing neuropathy but has been stable.   No changes in urinary frequency or flow   Review of Systems:  Review of Systems  Constitutional:  Negative for chills, fever and weight loss.  HENT:  Negative for tinnitus.   Respiratory:  Negative for cough, sputum production and shortness of breath.   Cardiovascular:  Negative for chest pain, palpitations and leg swelling.  Gastrointestinal:  Negative for abdominal pain, constipation, diarrhea and heartburn.  Genitourinary:  Negative for dysuria, frequency and urgency.  Musculoskeletal:  Negative for back pain, falls, joint pain and myalgias.  Skin: Negative.   Neurological:  Negative for dizziness and headaches.  Psychiatric/Behavioral:  Negative for depression and memory loss. The patient does not have insomnia.     Past Medical History:  Diagnosis Date   Allergy    Arthritis    hand   Chronic kidney disease    kidney stone "long time ago"   Hyperlipidemia    Hypertension    Stroke [facility]) 2019   Sep 22, 2017   Past Surgical History:  Procedure Laterality Date   sarcidosis surgery- 1984     pt states he isn't sure what was involved with this, possible lymph nodes removed   Social History:    reports that he has quit smoking. His smoking use included cigarettes. He quit smokeless tobacco use about 5 years ago.  His smokeless tobacco use included chew. He reports that he does not currently use alcohol. He reports that he does not currently use drugs.  Family History  Problem Relation Age of Onset   Pancreatic cancer Mother    Cancer Neg Hx    Hypertension Neg Hx    Heart disease Neg Hx    Colon cancer Neg Hx    Esophageal cancer Neg Hx    Rectal cancer Neg Hx    Stomach cancer Neg Hx     Medications: Patient's Medications  New Prescriptions   No medications on file  Previous Medications   AMLODIPINE (NORVASC) 10 MG TABLET    TAKE 1 TABLET(10 MG) BY MOUTH DAILY   ASPIRIN 325 MG EC TABLET    Take 325 mg by mouth daily.   CETIRIZINE HCL (ZYRTEC PO)    Take by mouth.   COENZYME Q10 100 MG CAPSULE    Take 2 capsules (200 mg total) by mouth daily.   GABAPENTIN (NEURONTIN) 300 MG CAPSULE    TAKE 1 CAPSULE(300 MG) BY MOUTH AT BEDTIME   ROSUVASTATIN (CRESTOR) 20 MG TABLET    TAKE 1 TABLET(20 MG) BY MOUTH DAILY  Modified Medications   No medications on file  Discontinued Medications   No medications on file    Physical Exam:  Vitals:  05/09/23 0842  BP: 120/78  Pulse: 63  Resp: 17  Temp: 97.7 F (36.5 C)  TempSrc: Temporal  SpO2: 99%  Weight: 165 lb 3.2 oz (74.9 kg)  Height: 5\' 8"  (1.727 m)   Body mass index is 25.12 kg/m. Wt Readings from Last 3 Encounters:  05/09/23 165 lb 3.2 oz (74.9 kg)  04/21/23 168 lb (76.2 kg)  11/08/22 164 lb (74.4 kg)    Physical Exam Constitutional:      General: He is not in acute distress.    Appearance: He is well-developed. He is not diaphoretic.  HENT:     Head: Normocephalic and atraumatic.     Right Ear: External ear normal.     Left Ear: External ear normal.     Mouth/Throat:     Pharynx: No oropharyngeal exudate.  Eyes:     Conjunctiva/sclera: Conjunctivae normal.     Pupils: Pupils are equal, round, and reactive  to light.  Cardiovascular:     Rate and Rhythm: Normal rate and regular rhythm.     Heart sounds: Normal heart sounds.  Pulmonary:     Effort: Pulmonary effort is normal.     Breath sounds: Normal breath sounds.  Abdominal:     General: Bowel sounds are normal.     Palpations: Abdomen is soft.  Musculoskeletal:        General: No tenderness.     Cervical back: Normal range of motion and neck supple.     Right lower leg: No edema.     Left lower leg: No edema.  Skin:    General: Skin is warm and dry.  Neurological:     Mental Status: He is alert and oriented to person, place, and time.     Motor: Weakness present.     Gait: Gait abnormal.     Labs reviewed: Basic Metabolic Panel: Recent Labs    11/08/22 1540  NA 140  K 4.2  CL 105  CO2 24  GLUCOSE 78  BUN 9  CREATININE 1.28  CALCIUM 9.3   Liver Function Tests: Recent Labs    11/08/22 1540  AST 16  ALT 14  BILITOT 0.5  PROT 7.1   No results for input(s): "LIPASE", "AMYLASE" in the last 8760 hours. No results for input(s): "AMMONIA" in the last 8760 hours. CBC: Recent Labs    11/08/22 1540  WBC 5.5  NEUTROABS 2,046  HGB 13.7  HCT 43.2  MCV 80.9  PLT 171   Lipid Panel: No results for input(s): "CHOL", "HDL", "LDLCALC", "TRIG", "CHOLHDL", "LDLDIRECT" in the last 8760 hours. TSH: No results for input(s): "TSH" in the last 8760 hours. A1C: Lab Results  Component Value Date   HGBA1C 5.9 (H) 11/08/2022     Assessment/Plan 1. Mixed hyperlipidemia (Primary) Continues on crestor 20 mg daily with dietary modifications - Lipid panel  2. Neuropathy, upper extremity, right Stable, continues on gabapentin 300 mg   3. Prediabetes Continue with dietary modifications - Hemoglobin A1c  4. Essential hypertension Blood pressure well controlled, goal bp <140/90 Continue current medications and dietary modifications follow metabolic panel - COMPLETE METABOLIC PANEL WITH GFR - CBC with  Differential/Platelet  5. 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 Benign prostatic hyperplasia with urinary frequency -symptoms stable - PSA   Return in about 6 months (around 11/07/2023) for routine follow up.  Janene Harvey. Janyth Contes,  [facility] [phone]

## 2023-05-10 LAB — PSA: PSA: 3.15 ng/mL (ref <=4.00)

## 2023-05-10 LAB — COMPLETE METABOLIC PANEL WITH GFR
AG Ratio: 1.4 (calc) (ref 1.0–2.5)
ALT: 13 U/L (ref 9–46)
AST: 16 U/L (ref 10–35)
Albumin: 4.3 g/dL (ref 3.6–5.1)
Alkaline phosphatase (APISO): 79 U/L (ref 35–144)
BUN/Creatinine Ratio: 9 (calc) (ref 6–22)
BUN: 12 mg/dL (ref 7–25)
CO2: 28 mmol/L (ref 20–32)
Calcium: 9.3 mg/dL (ref 8.6–10.3)
Chloride: 104 mmol/L (ref 98–110)
Creat: 1.37 mg/dL — ABNORMAL HIGH (ref 0.70–1.28)
Globulin: 3 g/dL (ref 1.9–3.7)
Glucose, Bld: 87 mg/dL (ref 65–99)
Potassium: 4.6 mmol/L (ref 3.5–5.3)
Sodium: 139 mmol/L (ref 135–146)
Total Bilirubin: 0.5 mg/dL (ref 0.2–1.2)
Total Protein: 7.3 g/dL (ref 6.1–8.1)
eGFR: 54 mL/min/{1.73_m2} — ABNORMAL LOW (ref >=60)

## 2023-05-10 LAB — LIPID PANEL
Cholesterol: 143 mg/dL (ref <200)
HDL: 58 mg/dL (ref >=40)
LDL Cholesterol (Calc): 66 mg/dL
Non-HDL Cholesterol (Calc): 85 mg/dL (ref <130)
Total CHOL/HDL Ratio: 2.5 (calc) (ref <5.0)
Triglycerides: 103 mg/dL (ref <150)

## 2023-05-10 LAB — CBC WITH DIFFERENTIAL/PLATELET
Absolute Lymphocytes: 2145 {cells}/uL (ref 850–3900)
Absolute Monocytes: 525 {cells}/uL (ref 200–950)
Basophils Absolute: 20 {cells}/uL (ref 0–200)
Basophils Relative: 0.4 %
Eosinophils Absolute: 240 {cells}/uL (ref 15–500)
Eosinophils Relative: 4.8 %
HCT: 42.4 % (ref 38.5–50.0)
Hemoglobin: 13.4 g/dL (ref 13.2–17.1)
MCH: 26 pg — ABNORMAL LOW (ref 27.0–33.0)
MCHC: 31.6 g/dL — ABNORMAL LOW (ref 32.0–36.0)
MCV: 82.2 fL (ref 80.0–100.0)
MPV: 11.3 fL (ref 7.5–12.5)
Monocytes Relative: 10.5 %
Neutro Abs: 2070 {cells}/uL (ref 1500–7800)
Neutrophils Relative %: 41.4 %
Platelets: 259 10*3/uL (ref 140–400)
RBC: 5.16 10*6/uL (ref 4.20–5.80)
RDW: 13.6 % (ref 11.0–15.0)
Total Lymphocyte: 42.9 %
WBC: 5 10*3/uL (ref 3.8–10.8)

## 2023-05-10 LAB — HEMOGLOBIN A1C
Hgb A1c MFr Bld: 5.9 %{Hb} — ABNORMAL HIGH (ref <5.7)
Mean Plasma Glucose: 123 mg/dL
eAG (mmol/L): 6.8 mmol/L

## 2023-05-15 ENCOUNTER — Other Ambulatory Visit: Payer: Self-pay | Admitting: Nurse Practitioner

## 2023-05-15 DIAGNOSIS — G5691 Unspecified mononeuropathy of right upper limb: Secondary | ICD-10-CM

## 2023-05-16 NOTE — Telephone Encounter (Addendum)
Patient is requesting a refill of the following medications: Requested Prescriptions   Pending Prescriptions Disp Refills   gabapentin (NEURONTIN) 300 MG capsule [Pharmacy Med Name: GABAPENTIN 300MG  CAPSULES] 90 capsule     Sig: TAKE 1 CAPSULE(300 MG) BY MOUTH AT BEDTIME    Date of last refill:11/12/2022  Refill amount: 90 capsules 2 refills   Patient should not be due for refill until march I believe.

## 2023-05-21 ENCOUNTER — Encounter: Payer: Self-pay | Admitting: Nurse Practitioner

## 2023-08-02 ENCOUNTER — Other Ambulatory Visit: Payer: Self-pay | Admitting: Nurse Practitioner

## 2023-08-02 DIAGNOSIS — E782 Mixed hyperlipidemia: Secondary | ICD-10-CM

## 2023-08-10 ENCOUNTER — Other Ambulatory Visit: Payer: Self-pay | Admitting: Nurse Practitioner

## 2023-08-10 DIAGNOSIS — I10 Essential (primary) hypertension: Secondary | ICD-10-CM

## 2023-08-12 ENCOUNTER — Other Ambulatory Visit: Payer: Self-pay | Admitting: Nurse Practitioner

## 2023-08-12 DIAGNOSIS — G5691 Unspecified mononeuropathy of right upper limb: Secondary | ICD-10-CM

## 2023-08-12 MED ORDER — GABAPENTIN 300 MG PO CAPS
300.0000 mg | ORAL_CAPSULE | Freq: Every day | ORAL | 3 refills | Status: DC
Start: 2023-08-12 — End: 2023-08-12

## 2023-08-12 MED ORDER — GABAPENTIN 300 MG PO CAPS: 300.0000 mg | ORAL_CAPSULE | Freq: Every day | ORAL | 3 refills | Status: DC

## 2023-08-12 NOTE — Telephone Encounter (Signed)
 Would not allow me to refill medication warning saying controlled substance.

## 2023-08-12 NOTE — Addendum Note (Signed)
 Addended by: Alben Spittle, Yerachmiel Spinney L on: 08/12/2023 10:17 AM   Modules accepted: Orders

## 2023-08-12 NOTE — Addendum Note (Signed)
 Addended by: Alben Spittle, Yordan Martindale L on: 08/12/2023 10:51 AM   Modules accepted: Orders

## 2023-11-14 ENCOUNTER — Encounter: Payer: Self-pay | Admitting: Nurse Practitioner

## 2023-11-14 ENCOUNTER — Other Ambulatory Visit: Payer: Self-pay | Admitting: Nurse Practitioner

## 2023-11-14 ENCOUNTER — Ambulatory Visit: Payer: Medicare PPO | Admitting: Nurse Practitioner

## 2023-11-14 VITALS — BP 138/80 | HR 61 | Temp 98.1°F | Ht 68.0 in | Wt 161.4 lb

## 2023-11-14 DIAGNOSIS — I69322 Dysarthria following cerebral infarction: Secondary | ICD-10-CM | POA: Diagnosis not present

## 2023-11-14 DIAGNOSIS — E782 Mixed hyperlipidemia: Secondary | ICD-10-CM

## 2023-11-14 DIAGNOSIS — N401 Enlarged prostate with lower urinary tract symptoms: Secondary | ICD-10-CM | POA: Insufficient documentation

## 2023-11-14 DIAGNOSIS — I10 Essential (primary) hypertension: Secondary | ICD-10-CM

## 2023-11-14 DIAGNOSIS — R7303 Prediabetes: Secondary | ICD-10-CM | POA: Diagnosis not present

## 2023-11-14 DIAGNOSIS — G5691 Unspecified mononeuropathy of right upper limb: Secondary | ICD-10-CM

## 2023-11-14 DIAGNOSIS — R350 Frequency of micturition: Secondary | ICD-10-CM

## 2023-11-14 DIAGNOSIS — D649 Anemia, unspecified: Secondary | ICD-10-CM

## 2023-11-14 NOTE — Assessment & Plan Note (Signed)
 Controlled at this time, not on any medication

## 2023-11-14 NOTE — Patient Instructions (Signed)
 Stop gabapentin  at this time- if symptoms return then let us  know and we will restart

## 2023-11-14 NOTE — Progress Notes (Unsigned)
 Careteam: Patient Care Team: [name], NP as PCP - General (Geriatric Medicine)  PLACE OF SERVICE:  [facility] CLINIC  Advanced Directive information    No Known Allergies  Chief Complaint  Patient presents with   Follow-up    6 month follow up    HPI:  Discussed the use of AI scribe software for clinical note transcription with the patient, who gave verbal consent to proceed.  History of Present Illness Randall James is a 73 year old male who presents for a six-month follow-up.  He has no current concerns and is doing fine. He has not been playing much golf but remains active with softball. No chest pain, abnormal joint pain, changes in bowel habits, constipation, or diarrhea. He mentions mild arthritis in his knees but states it does not bother him.  He is taking amlodipine  10 mg daily for hypertension and aspirin 325 mg daily due to his history of stroke. He was taking gabapentin  at bedtime for numbness and tingling on the stroke-affected side but has not noticed any difference when not taking it and reports no current numbness or tingling. He has about seven pills left and okay to stop taking it. He is also taking Crestor  20 mg.  His A1c is being rechecked today as he is in the pre-diabetic range. No changes in urinary frequency or flow, although he notes increased frequency at night if he drinks a lot of water. He has lost some weight, which he attributes to being active.    Review of Systems:  Review of Systems  Constitutional:  Negative for chills, fever and weight loss.  HENT:  Negative for tinnitus.   Respiratory:  Negative for cough, sputum production and shortness of breath.   Cardiovascular:  Negative for chest pain, palpitations and leg swelling.  Gastrointestinal:  Negative for abdominal pain, constipation, diarrhea and heartburn.  Genitourinary:  Negative for dysuria, frequency and urgency.  Musculoskeletal:  Negative for back pain, falls, joint  pain and myalgias.  Skin: Negative.   Neurological:  Negative for dizziness and headaches.  Psychiatric/Behavioral:  Negative for depression and memory loss. The patient does not have insomnia.   ***  Past Medical History:  Diagnosis Date   Allergy    Arthritis    hand   Chronic kidney disease    kidney stone long time ago   Hyperlipidemia    Hypertension    Stroke [facility]) 2019   Sep 22, 2017   Past Surgical History:  Procedure Laterality Date   sarcidosis surgery- 1984     pt states he isn't sure what was involved with this, possible lymph nodes removed   Social History:   reports that he has quit smoking. His smoking use included cigarettes. He quit smokeless tobacco use about 6 years ago.  His smokeless tobacco use included chew. He reports that he does not currently use alcohol. He reports that he does not currently use drugs.  Family History  Problem Relation Age of Onset   Pancreatic cancer Mother    Cancer Neg Hx    Hypertension Neg Hx    Heart disease Neg Hx    Colon cancer Neg Hx    Esophageal cancer Neg Hx    Rectal cancer Neg Hx    Stomach cancer Neg Hx     Medications: Patient's Medications  New Prescriptions   No medications on file  Previous Medications   AMLODIPINE  (NORVASC ) 10 MG TABLET    TAKE 1 TABLET(10  MG) BY MOUTH DAILY   ASPIRIN 325 MG EC TABLET    Take 325 mg by mouth daily.   CETIRIZINE HCL (ZYRTEC PO)    Take by mouth.   COENZYME Q10 100 MG CAPSULE    Take 2 capsules (200 mg total) by mouth daily.   GABAPENTIN  (NEURONTIN ) 300 MG CAPSULE    Take 1 capsule (300 mg total) by mouth at bedtime. TAKE 1 CAPSULE(300 MG) BY MOUTH AT BEDTIME   ROSUVASTATIN  (CRESTOR ) 20 MG TABLET    TAKE 1 TABLET(20 MG) BY MOUTH DAILY  Modified Medications   No medications on file  Discontinued Medications   No medications on file    Physical Exam:  Vitals:   11/14/23 0849  BP: 138/80  Pulse: 61  Temp: 98.1 F (36.7 C)  SpO2: 100%  Weight: 161 lb 6.4 oz  (73.2 kg)  Height: 5' 8 (1.727 m)   Body mass index is 24.54 kg/m. Wt Readings from Last 3 Encounters:  11/14/23 161 lb 6.4 oz (73.2 kg)  05/09/23 165 lb 3.2 oz (74.9 kg)  04/21/23 168 lb (76.2 kg)    Physical Exam Constitutional:      General: He is not in acute distress.    Appearance: He is well-developed. He is not diaphoretic.  HENT:     Head: Normocephalic and atraumatic.     Right Ear: External ear normal.     Left Ear: External ear normal.     Mouth/Throat:     Pharynx: No oropharyngeal exudate.   Eyes:     Conjunctiva/sclera: Conjunctivae normal.     Pupils: Pupils are equal, round, and reactive to light.    Cardiovascular:     Rate and Rhythm: Normal rate and regular rhythm.     Heart sounds: Normal heart sounds.  Pulmonary:     Effort: Pulmonary effort is normal.     Breath sounds: Normal breath sounds.  Abdominal:     General: Bowel sounds are normal.     Palpations: Abdomen is soft.   Musculoskeletal:        General: No tenderness.     Cervical back: Normal range of motion and neck supple.     Right lower leg: No edema.     Left lower leg: No edema.   Skin:    General: Skin is warm and dry.   Neurological:     Mental Status: He is alert and oriented to person, place, and time.   ***  Labs reviewed: Basic Metabolic Panel: Recent Labs    05/09/23 1058  NA 139  K 4.6  CL 104  CO2 28  GLUCOSE 87  BUN 12  CREATININE 1.37*  CALCIUM  9.3   Liver Function Tests: Recent Labs    05/09/23 1058  AST 16  ALT 13  BILITOT 0.5  PROT 7.3   No results for input(s): LIPASE, AMYLASE in the last 8760 hours. No results for input(s): AMMONIA in the last 8760 hours. CBC: Recent Labs    05/09/23 1058  WBC 5.0  NEUTROABS 2,070  HGB 13.4  HCT 42.4  MCV 82.2  PLT 259   Lipid Panel: Recent Labs    05/09/23 1058  CHOL 143  HDL 58  LDLCALC 66  TRIG 103  CHOLHDL 2.5   TSH: No results for input(s): TSH in the last 8760  hours. A1C: Lab Results  Component Value Date   HGBA1C 5.9 (H) 05/09/2023     Assessment/Plan *** There are no diagnoses linked  to this encounter.   No follow-ups on file.: ***  Tailey Top K. Caro BODILY [facility] [phone]

## 2023-11-14 NOTE — Assessment & Plan Note (Signed)
 Stable, continues on statin and ASA for prevention

## 2023-11-17 ENCOUNTER — Ambulatory Visit: Payer: Self-pay | Admitting: Nurse Practitioner

## 2023-11-17 DIAGNOSIS — N1831 Chronic kidney disease, stage 3a: Secondary | ICD-10-CM

## 2023-11-19 DIAGNOSIS — G5691 Unspecified mononeuropathy of right upper limb: Secondary | ICD-10-CM | POA: Insufficient documentation

## 2023-11-19 LAB — CBC WITH DIFFERENTIAL/PLATELET
Absolute Lymphocytes: 2894 {cells}/uL (ref 850–3900)
Absolute Monocytes: 632 {cells}/uL (ref 200–950)
Basophils Absolute: 17 {cells}/uL (ref 0–200)
Basophils Relative: 0.3 %
Eosinophils Absolute: 249 {cells}/uL (ref 15–500)
Eosinophils Relative: 4.3 %
HCT: 42.3 % (ref 38.5–50.0)
Hemoglobin: 13.1 g/dL — ABNORMAL LOW (ref 13.2–17.1)
MCH: 25.6 pg — ABNORMAL LOW (ref 27.0–33.0)
MCHC: 31 g/dL — ABNORMAL LOW (ref 32.0–36.0)
MCV: 82.8 fL (ref 80.0–100.0)
MPV: 11.3 fL (ref 7.5–12.5)
Monocytes Relative: 10.9 %
Neutro Abs: 2007 {cells}/uL (ref 1500–7800)
Neutrophils Relative %: 34.6 %
Platelets: 182 10*3/uL (ref 140–400)
RBC: 5.11 10*6/uL (ref 4.20–5.80)
RDW: 13.7 % (ref 11.0–15.0)
Total Lymphocyte: 49.9 %
WBC: 5.8 10*3/uL (ref 3.8–10.8)

## 2023-11-19 LAB — COMPREHENSIVE METABOLIC PANEL WITH GFR
AG Ratio: 1.7 (calc) (ref 1.0–2.5)
ALT: 11 U/L (ref 9–46)
AST: 16 U/L (ref 10–35)
Albumin: 4.2 g/dL (ref 3.6–5.1)
Alkaline phosphatase (APISO): 62 U/L (ref 35–144)
BUN/Creatinine Ratio: 8 (calc) (ref 6–22)
BUN: 11 mg/dL (ref 7–25)
CO2: 24 mmol/L (ref 20–32)
Calcium: 9.2 mg/dL (ref 8.6–10.3)
Chloride: 106 mmol/L (ref 98–110)
Creat: 1.45 mg/dL — ABNORMAL HIGH (ref 0.70–1.28)
Globulin: 2.5 g/dL (ref 1.9–3.7)
Glucose, Bld: 91 mg/dL (ref 65–99)
Potassium: 4.3 mmol/L (ref 3.5–5.3)
Sodium: 139 mmol/L (ref 135–146)
Total Bilirubin: 0.5 mg/dL (ref 0.2–1.2)
Total Protein: 6.7 g/dL (ref 6.1–8.1)
eGFR: 51 mL/min/{1.73_m2} — ABNORMAL LOW (ref >=60)

## 2023-11-19 LAB — HEMOGLOBIN A1C
Hgb A1c MFr Bld: 5.8 % — ABNORMAL HIGH (ref <5.7)
Mean Plasma Glucose: 120 mg/dL
eAG (mmol/L): 6.6 mmol/L

## 2023-11-19 LAB — IRON,TIBC AND FERRITIN PANEL
%SAT: 33 % (ref 20–48)
Ferritin: 52 ng/mL (ref 24–380)
Iron: 110 ug/dL (ref 50–180)
TIBC: 333 ug/dL (ref 250–425)

## 2023-11-19 NOTE — Assessment & Plan Note (Signed)
 Continues on crestor

## 2023-11-19 NOTE — Telephone Encounter (Signed)
 Recommend drinking 64 oz of water a day, lets have him come back to office in 1 month to recheck lab to make sure it does not continue to worsen.

## 2023-11-19 NOTE — Assessment & Plan Note (Signed)
 Continue on dietary modification.

## 2023-11-19 NOTE — Assessment & Plan Note (Signed)
 Blood pressure well controlled, goal bp <140/90 Continue current medications and dietary modifications follow metabolic panel

## 2023-11-19 NOTE — Assessment & Plan Note (Signed)
 No longer having symptoms, will have him stop gabapentin 

## 2023-12-15 ENCOUNTER — Other Ambulatory Visit

## 2023-12-15 DIAGNOSIS — N1831 Chronic kidney disease, stage 3a: Secondary | ICD-10-CM

## 2023-12-15 LAB — BASIC METABOLIC PANEL WITH GFR
BUN/Creatinine Ratio: 8 (calc) (ref 6–22)
BUN: 12 mg/dL (ref 7–25)
CO2: 29 mmol/L (ref 20–32)
Calcium: 9.6 mg/dL (ref 8.6–10.3)
Chloride: 105 mmol/L (ref 98–110)
Creat: 1.42 mg/dL — ABNORMAL HIGH (ref 0.70–1.28)
Glucose, Bld: 92 mg/dL (ref 65–99)
Potassium: 4.8 mmol/L (ref 3.5–5.3)
Sodium: 140 mmol/L (ref 135–146)
eGFR: 52 mL/min/1.73m2 — ABNORMAL LOW (ref >=60)

## 2023-12-16 ENCOUNTER — Ambulatory Visit: Payer: Self-pay | Admitting: Nurse Practitioner

## 2024-03-01 ENCOUNTER — Other Ambulatory Visit: Payer: Self-pay | Admitting: Nurse Practitioner

## 2024-03-01 DIAGNOSIS — I10 Essential (primary) hypertension: Secondary | ICD-10-CM

## 2024-04-23 ENCOUNTER — Encounter: Payer: Medicare PPO | Admitting: Nurse Practitioner

## 2024-04-26 ENCOUNTER — Encounter: Payer: Medicare PPO | Admitting: Nurse Practitioner

## 2024-05-10 ENCOUNTER — Encounter: Payer: Self-pay | Admitting: Nurse Practitioner

## 2024-05-17 ENCOUNTER — Encounter: Payer: Self-pay | Admitting: Nurse Practitioner

## 2024-05-17 ENCOUNTER — Ambulatory Visit: Payer: Self-pay | Admitting: Nurse Practitioner

## 2024-05-17 VITALS — BP 126/72 | HR 70 | Temp 97.5°F | Ht 68.0 in | Wt 162.4 lb

## 2024-05-17 DIAGNOSIS — R7303 Prediabetes: Secondary | ICD-10-CM | POA: Diagnosis not present

## 2024-05-17 DIAGNOSIS — R350 Frequency of micturition: Secondary | ICD-10-CM

## 2024-05-17 DIAGNOSIS — D649 Anemia, unspecified: Secondary | ICD-10-CM | POA: Diagnosis not present

## 2024-05-17 DIAGNOSIS — I69322 Dysarthria following cerebral infarction: Secondary | ICD-10-CM | POA: Diagnosis not present

## 2024-05-17 DIAGNOSIS — N401 Enlarged prostate with lower urinary tract symptoms: Secondary | ICD-10-CM

## 2024-05-17 DIAGNOSIS — G5691 Unspecified mononeuropathy of right upper limb: Secondary | ICD-10-CM | POA: Diagnosis not present

## 2024-05-17 DIAGNOSIS — N1831 Chronic kidney disease, stage 3a: Secondary | ICD-10-CM | POA: Diagnosis not present

## 2024-05-17 DIAGNOSIS — I10 Essential (primary) hypertension: Secondary | ICD-10-CM

## 2024-05-17 DIAGNOSIS — E782 Mixed hyperlipidemia: Secondary | ICD-10-CM | POA: Diagnosis not present

## 2024-05-17 MED ORDER — TAMSULOSIN HCL 0.4 MG PO CAPS: 0.4000 mg | ORAL_CAPSULE | Freq: Every day | ORAL | 1 refills | Status: AC

## 2024-05-17 NOTE — Patient Instructions (Signed)
 Start flomax  daily in the evening- this is to help with urinary frequency at night

## 2024-05-17 NOTE — Progress Notes (Signed)
 "   Careteam: Patient Care Team: [name], NP as PCP - General (Geriatric Medicine)  PLACE OF SERVICE:  [facility] CLINIC  Advanced Directive information    Allergies[1]  Chief Complaint  Patient presents with   medication management fo chronic issues.    Patient present today for routine 6 month follow up. No care gaps to discuss at this time.     HPI:  Discussed the use of AI scribe software for clinical note transcription with the patient, who gave verbal consent to proceed.  History of Present Illness Randall James is a 73 year old male who presents for a six-month follow-up.  He has hypertension and takes Norvasc  10 mg daily. No dizziness, lightheadedness, or swelling in the legs.  He has chronic kidney disease. He stays hydrated and avoids NSAIDs such as Advil, Aleve, and Motrin.  He has high cholesterol and is taking Crestor  20 mg daily. No muscle aches or pains.  He has dysarthria due to a previous CVA and is on aspirin. No worsening of speech or other deficits from the stroke.  He has impaired fasting glucose noted on previous lab  He experiences increased frequency of urination at night, getting up three to four times. No chest pain, shortness of breath, constipation, or abnormal aches or pains.  He mentions he probably needs an eye exam now. Will make appt     Review of Systems:  Review of Systems  Constitutional:  Negative for chills, fever and weight loss.  HENT:  Negative for tinnitus.   Respiratory:  Negative for cough, sputum production and shortness of breath.   Cardiovascular:  Negative for chest pain, palpitations and leg swelling.  Gastrointestinal:  Negative for abdominal pain, constipation, diarrhea and heartburn.  Genitourinary:  Positive for frequency (at night). Negative for dysuria and urgency.  Musculoskeletal:  Negative for back pain, falls, joint pain and myalgias.  Skin: Negative.   Neurological:  Negative for dizziness and  headaches.  Psychiatric/Behavioral:  Negative for depression and memory loss. The patient does not have insomnia.     Past Medical History:  Diagnosis Date   Allergy    Arthritis    hand   Chronic kidney disease    kidney stone long time ago   Hyperlipidemia    Hypertension    Stroke [facility]) 2019   Sep 22, 2017   Past Surgical History:  Procedure Laterality Date   sarcidosis surgery- 1984     pt states he isn't sure what was involved with this, possible lymph nodes removed   Social History:   reports that he has quit smoking. His smoking use included cigarettes. He quit smokeless tobacco use about 6 years ago.  His smokeless tobacco use included chew. He reports that he does not currently use alcohol. He reports that he does not currently use drugs.  Family History  Problem Relation Age of Onset   Pancreatic cancer Mother    Cancer Neg Hx    Hypertension Neg Hx    Heart disease Neg Hx    Colon cancer Neg Hx    Esophageal cancer Neg Hx    Rectal cancer Neg Hx    Stomach cancer Neg Hx     Medications: Patient's Medications  New Prescriptions   No medications on file  Previous Medications   AMLODIPINE  (NORVASC ) 10 MG TABLET    TAKE 1 TABLET(10 MG) BY MOUTH DAILY   ASPIRIN 325 MG EC TABLET    Take 325 mg by  mouth daily.   CETIRIZINE HCL (ZYRTEC PO)    Take by mouth.   COENZYME Q10 100 MG CAPSULE    Take 2 capsules (200 mg total) by mouth daily.   ROSUVASTATIN  (CRESTOR ) 20 MG TABLET    TAKE 1 TABLET(20 MG) BY MOUTH DAILY  Modified Medications   No medications on file  Discontinued Medications   No medications on file    Physical Exam:  Vitals:   05/14/24 1325  BP: 126/72  Pulse: 70  Temp: (!) 97.5 F (36.4 C)  SpO2: 98%  Weight: 162 lb 6.4 oz (73.7 kg)  Height: 5' 8 (1.727 m)   Body mass index is 24.69 kg/m. Wt Readings from Last 3 Encounters:  05/14/24 162 lb 6.4 oz (73.7 kg)  11/14/23 161 lb 6.4 oz (73.2 kg)  05/09/23 165 lb 3.2 oz (74.9 kg)     Physical Exam Constitutional:      General: He is not in acute distress.    Appearance: He is well-developed. He is not diaphoretic.  HENT:     Head: Normocephalic and atraumatic.     Right Ear: External ear normal.     Left Ear: External ear normal.     Mouth/Throat:     Pharynx: No oropharyngeal exudate.  Eyes:     Conjunctiva/sclera: Conjunctivae normal.     Pupils: Pupils are equal, round, and reactive to light.  Cardiovascular:     Rate and Rhythm: Normal rate and regular rhythm.     Heart sounds: Normal heart sounds.  Pulmonary:     Effort: Pulmonary effort is normal.     Breath sounds: Normal breath sounds.  Abdominal:     General: Bowel sounds are normal.     Palpations: Abdomen is soft.  Musculoskeletal:        General: No tenderness.     Cervical back: Normal range of motion and neck supple.     Right lower leg: No edema.     Left lower leg: No edema.  Skin:    General: Skin is warm and dry.  Neurological:     Mental Status: He is alert and oriented to person, place, and time.     Labs reviewed: Basic Metabolic Panel: Recent Labs    11/14/23 0913 12/15/23 1022  NA 139 140  K 4.3 4.8  CL 106 105  CO2 24 29  GLUCOSE 91 92  BUN 11 12  CREATININE 1.45* 1.42*  CALCIUM  9.2 9.6   Liver Function Tests: Recent Labs    11/14/23 0913  AST 16  ALT 11  BILITOT 0.5  PROT 6.7   No results for input(s): LIPASE, AMYLASE in the last 8760 hours. No results for input(s): AMMONIA in the last 8760 hours. CBC: Recent Labs    11/14/23 0913  WBC 5.8  NEUTROABS 2,007  HGB 13.1*  HCT 42.3  MCV 82.8  PLT 182   Lipid Panel: No results for input(s): CHOL, HDL, LDLCALC, TRIG, CHOLHDL, LDLDIRECT in the last 8760 hours. TSH: No results for input(s): TSH in the last 8760 hours. A1C: Lab Results  Component Value Date   HGBA1C 5.8 (H) 11/14/2023     Assessment/Plan  Assessment & Plan Benign prostatic hyperplasia with lower urinary  tract symptoms Increased nocturia, waking 3-4 times per night.  - Prescribed Flomax  to be taken at night after supper. - Advised on potential side effects of Flomax , including orthostatic hypotension. - Instructed to rise slowly from bed to prevent dizziness. - recheck PSA  Essential hypertension Blood pressure is well controlled on Norvasc  10 mg daily.  Chronic kidney disease stage 3a Advised to avoid NSAIDs to prevent further kidney damage. - Ordered lab work to monitor kidney function.  Mixed hyperlipidemia Managed with Crestor  20 mg daily. - Ordered lab work to monitor cholesterol levels.  Dysarthria following cerebral infarction Dysarthria secondary to previous CVA. No worsening of speech or new deficits reported. Continues ASA and statin  Right upper extremity neuropathy Symptoms are currently well-managed off medication  Prediabetes Impaired fasting glucose. - Ordered lab work to monitor glucose levels.  Anemia - Ordered lab work to check blood counts.   Return in about 6 months (around 11/15/2024) for routine follow up, labs at time of visit.  Davonn Flanery K. Caro BODILY [facility] [phone]     [1] No Known Allergies  "

## 2024-05-18 ENCOUNTER — Ambulatory Visit: Payer: Self-pay | Admitting: Nurse Practitioner

## 2024-05-18 LAB — CBC WITH DIFFERENTIAL/PLATELET
Absolute Lymphocytes: 2354 {cells}/uL (ref 850–3900)
Absolute Monocytes: 621 {cells}/uL (ref 200–950)
Basophils Absolute: 29 {cells}/uL (ref 0–200)
Basophils Relative: 0.5 %
Eosinophils Absolute: 291 {cells}/uL (ref 15–500)
Eosinophils Relative: 5.1 %
HCT: 41.3 % (ref 39.4–51.1)
Hemoglobin: 13 g/dL — ABNORMAL LOW (ref 13.2–17.1)
MCH: 25.8 pg — ABNORMAL LOW (ref 27.0–33.0)
MCHC: 31.5 g/dL — ABNORMAL LOW (ref 31.6–35.4)
MCV: 82.1 fL (ref 81.4–101.7)
MPV: 11.4 fL (ref 7.5–12.5)
Monocytes Relative: 10.9 %
Neutro Abs: 2405 {cells}/uL (ref 1500–7800)
Neutrophils Relative %: 42.2 %
Platelets: 211 Thousand/uL (ref 140–400)
RBC: 5.03 Million/uL (ref 4.20–5.80)
RDW: 13.8 % (ref 11.0–15.0)
Total Lymphocyte: 41.3 %
WBC: 5.7 Thousand/uL (ref 3.8–10.8)

## 2024-05-18 LAB — COMPREHENSIVE METABOLIC PANEL WITH GFR
AG Ratio: 1.6 (calc) (ref 1.0–2.5)
ALT: 14 U/L (ref 9–46)
AST: 19 U/L (ref 10–35)
Albumin: 4.4 g/dL (ref 3.6–5.1)
Alkaline phosphatase (APISO): 65 U/L (ref 35–144)
BUN/Creatinine Ratio: 7 (calc) (ref 6–22)
BUN: 10 mg/dL (ref 7–25)
CO2: 29 mmol/L (ref 20–32)
Calcium: 9.3 mg/dL (ref 8.6–10.3)
Chloride: 107 mmol/L (ref 98–110)
Creat: 1.39 mg/dL — ABNORMAL HIGH (ref 0.70–1.28)
Globulin: 2.8 g/dL (ref 1.9–3.7)
Glucose, Bld: 89 mg/dL (ref 65–99)
Potassium: 4.6 mmol/L (ref 3.5–5.3)
Sodium: 143 mmol/L (ref 135–146)
Total Bilirubin: 0.5 mg/dL (ref 0.2–1.2)
Total Protein: 7.2 g/dL (ref 6.1–8.1)
eGFR: 53 mL/min/1.73m2 — ABNORMAL LOW

## 2024-05-18 LAB — HEMOGLOBIN A1C
Hgb A1c MFr Bld: 5.8 % — ABNORMAL HIGH
Mean Plasma Glucose: 120 mg/dL
eAG (mmol/L): 6.6 mmol/L

## 2024-05-18 LAB — LIPID PANEL
Cholesterol: 156 mg/dL
HDL: 65 mg/dL
LDL Cholesterol (Calc): 73 mg/dL
Non-HDL Cholesterol (Calc): 91 mg/dL
Total CHOL/HDL Ratio: 2.4 (calc)
Triglycerides: 93 mg/dL

## 2024-05-18 LAB — PSA: PSA: 3.16 ng/mL

## 2024-05-21 ENCOUNTER — Ambulatory Visit: Admitting: Nurse Practitioner

## 2024-06-03 ENCOUNTER — Other Ambulatory Visit: Payer: Self-pay | Admitting: Nurse Practitioner

## 2024-06-03 DIAGNOSIS — I10 Essential (primary) hypertension: Secondary | ICD-10-CM

## 2024-06-11 ENCOUNTER — Ambulatory Visit: Admitting: Nurse Practitioner

## 2024-06-21 ENCOUNTER — Ambulatory Visit: Admitting: Nurse Practitioner

## 2024-06-29 ENCOUNTER — Ambulatory Visit: Admitting: Nurse Practitioner

## 2024-07-19 ENCOUNTER — Ambulatory Visit: Admitting: Nurse Practitioner

## 2024-11-15 ENCOUNTER — Ambulatory Visit: Admitting: Nurse Practitioner
